# Patient Record
Sex: Female | Born: 1990 | Race: Black or African American | Hispanic: No | Marital: Single | Smoking: Former smoker
Health system: Southern US, Community
[De-identification: ages and names within clinical notes are randomized; demographics above are authoritative.]

## PROBLEM LIST (undated history)

## (undated) ENCOUNTER — Inpatient Hospital Stay (HOSPITAL_COMMUNITY): Payer: Self-pay

## (undated) DIAGNOSIS — A599 Trichomoniasis, unspecified: Secondary | ICD-10-CM

## (undated) DIAGNOSIS — A749 Chlamydial infection, unspecified: Secondary | ICD-10-CM

## (undated) HISTORY — PX: NO PAST SURGERIES: SHX2092

---
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
## 2008-06-09 ENCOUNTER — Inpatient Hospital Stay (HOSPITAL_COMMUNITY): Admission: AD | Admit: 2008-06-09 | Discharge: 2008-06-12 | Payer: Self-pay | Admitting: Obstetrics

## 2008-06-22 ENCOUNTER — Inpatient Hospital Stay (HOSPITAL_COMMUNITY): Admission: AD | Admit: 2008-06-22 | Discharge: 2008-06-22 | Payer: Self-pay | Admitting: Obstetrics

## 2008-07-14 ENCOUNTER — Inpatient Hospital Stay (HOSPITAL_COMMUNITY): Admission: AD | Admit: 2008-07-14 | Discharge: 2008-07-14 | Payer: Self-pay | Admitting: Obstetrics

## 2008-08-02 ENCOUNTER — Inpatient Hospital Stay (HOSPITAL_COMMUNITY): Admission: AD | Admit: 2008-08-02 | Discharge: 2008-08-05 | Payer: Self-pay | Admitting: Obstetrics

## 2008-08-03 ENCOUNTER — Encounter (INDEPENDENT_AMBULATORY_CARE_PROVIDER_SITE_OTHER): Payer: Self-pay | Admitting: Obstetrics

## 2009-04-26 ENCOUNTER — Ambulatory Visit: Payer: Self-pay | Admitting: Physician Assistant

## 2009-04-26 ENCOUNTER — Inpatient Hospital Stay (HOSPITAL_COMMUNITY): Admission: AD | Admit: 2009-04-26 | Discharge: 2009-04-26 | Payer: Self-pay | Admitting: Family Medicine

## 2009-09-28 ENCOUNTER — Ambulatory Visit (HOSPITAL_COMMUNITY): Admission: RE | Admit: 2009-09-28 | Discharge: 2009-09-28 | Payer: Self-pay | Admitting: Obstetrics

## 2009-10-03 ENCOUNTER — Inpatient Hospital Stay (HOSPITAL_COMMUNITY): Admission: AD | Admit: 2009-10-03 | Discharge: 2009-10-03 | Payer: Self-pay | Admitting: Obstetrics

## 2009-10-04 ENCOUNTER — Observation Stay (HOSPITAL_COMMUNITY): Admission: AD | Admit: 2009-10-04 | Discharge: 2009-10-04 | Payer: Self-pay | Admitting: Obstetrics

## 2009-10-05 ENCOUNTER — Inpatient Hospital Stay (HOSPITAL_COMMUNITY): Admission: AD | Admit: 2009-10-05 | Discharge: 2009-10-07 | Payer: Self-pay | Admitting: Obstetrics

## 2010-05-15 ENCOUNTER — Emergency Department (HOSPITAL_COMMUNITY): Admission: EM | Admit: 2010-05-15 | Discharge: 2010-05-16 | Payer: Self-pay | Admitting: Emergency Medicine

## 2010-06-10 ENCOUNTER — Emergency Department (HOSPITAL_COMMUNITY): Admission: EM | Admit: 2010-06-10 | Discharge: 2010-06-10 | Payer: Self-pay | Admitting: Emergency Medicine

## 2010-06-13 ENCOUNTER — Emergency Department (HOSPITAL_COMMUNITY): Admission: EM | Admit: 2010-06-13 | Discharge: 2010-06-14 | Payer: Self-pay | Admitting: Emergency Medicine

## 2010-06-14 ENCOUNTER — Emergency Department (HOSPITAL_COMMUNITY): Admission: EM | Admit: 2010-06-14 | Discharge: 2010-06-15 | Payer: Self-pay | Admitting: Emergency Medicine

## 2010-08-21 ENCOUNTER — Emergency Department (HOSPITAL_COMMUNITY)
Admission: EM | Admit: 2010-08-21 | Discharge: 2010-08-22 | Payer: Self-pay | Source: Home / Self Care | Admitting: Emergency Medicine

## 2010-08-21 LAB — COMPREHENSIVE METABOLIC PANEL
Albumin: 4.2 g/dL (ref 3.5–5.2)
Alkaline Phosphatase: 49 U/L (ref 39–117)
BUN: 9 mg/dL (ref 6–23)
CO2: 23 mEq/L (ref 19–32)
Chloride: 108 mEq/L (ref 96–112)
Glucose, Bld: 116 mg/dL — ABNORMAL HIGH (ref 70–99)
Potassium: 3.5 mEq/L (ref 3.5–5.1)
Total Bilirubin: 1.1 mg/dL (ref 0.3–1.2)

## 2010-08-21 LAB — DIFFERENTIAL
Lymphocytes Relative: 9 % — ABNORMAL LOW (ref 12–46)
Lymphs Abs: 1 10*3/uL (ref 0.7–4.0)
Neutro Abs: 9.2 10*3/uL — ABNORMAL HIGH (ref 1.7–7.7)
Neutrophils Relative %: 85 % — ABNORMAL HIGH (ref 43–77)

## 2010-08-21 LAB — URINALYSIS, ROUTINE W REFLEX MICROSCOPIC
Bilirubin Urine: NEGATIVE
Ketones, ur: 15 mg/dL — AB
Nitrite: NEGATIVE
Protein, ur: NEGATIVE mg/dL
Specific Gravity, Urine: 1.017 (ref 1.005–1.030)
Urine Glucose, Fasting: NEGATIVE mg/dL
Urobilinogen, UA: 1 mg/dL (ref 0.0–1.0)
pH: 8.5 — ABNORMAL HIGH (ref 5.0–8.0)

## 2010-08-21 LAB — CBC
HCT: 40.1 % (ref 36.0–46.0)
Hemoglobin: 13.2 g/dL (ref 12.0–15.0)
MCV: 87.4 fL (ref 78.0–100.0)
Platelets: 258 10*3/uL (ref 150–400)
RBC: 4.59 MIL/uL (ref 3.87–5.11)
WBC: 10.8 10*3/uL — ABNORMAL HIGH (ref 4.0–10.5)

## 2010-08-21 LAB — POCT PREGNANCY, URINE: Preg Test, Ur: NEGATIVE

## 2010-08-21 LAB — URINE MICROSCOPIC-ADD ON

## 2010-08-21 LAB — WET PREP, GENITAL: Yeast Wet Prep HPF POC: NONE SEEN

## 2010-08-23 LAB — GC/CHLAMYDIA PROBE AMP, GENITAL: GC Probe Amp, Genital: NEGATIVE

## 2010-10-16 LAB — CBC
Hemoglobin: 10.6 g/dL — ABNORMAL LOW (ref 12.0–15.0)
MCHC: 33.2 g/dL (ref 30.0–36.0)
RBC: 3.55 MIL/uL — ABNORMAL LOW (ref 3.87–5.11)

## 2010-10-27 LAB — GC/CHLAMYDIA PROBE AMP, GENITAL
Chlamydia, DNA Probe: NEGATIVE
GC Probe Amp, Genital: NEGATIVE

## 2010-10-27 LAB — WET PREP, GENITAL
Clue Cells Wet Prep HPF POC: NONE SEEN
Trich, Wet Prep: NONE SEEN

## 2010-11-07 LAB — CBC
MCHC: 33.8 g/dL (ref 30.0–36.0)
Platelets: 212 10*3/uL (ref 150–400)
Platelets: 244 10*3/uL (ref 150–400)
RBC: 3.7 MIL/uL — ABNORMAL LOW (ref 3.87–5.11)
RDW: 13.3 % (ref 11.5–15.5)
WBC: 11.6 10*3/uL — ABNORMAL HIGH (ref 4.0–10.5)

## 2010-11-07 LAB — RPR: RPR Ser Ql: NONREACTIVE

## 2010-11-14 ENCOUNTER — Emergency Department (HOSPITAL_COMMUNITY)
Admission: EM | Admit: 2010-11-14 | Discharge: 2010-11-15 | Disposition: A | Discharge status: Home / Self Care | Payer: Self-pay | Attending: Emergency Medicine | Admitting: Emergency Medicine

## 2010-11-14 DIAGNOSIS — F329 Major depressive disorder, single episode, unspecified: Secondary | ICD-10-CM | POA: Insufficient documentation

## 2010-11-14 DIAGNOSIS — W260XXA Contact with knife, initial encounter: Secondary | ICD-10-CM | POA: Insufficient documentation

## 2010-11-14 DIAGNOSIS — S51809A Unspecified open wound of unspecified forearm, initial encounter: Secondary | ICD-10-CM | POA: Insufficient documentation

## 2010-11-14 DIAGNOSIS — F3289 Other specified depressive episodes: Secondary | ICD-10-CM | POA: Insufficient documentation

## 2010-11-15 LAB — CBC
Hemoglobin: 13.4 g/dL (ref 12.0–15.0)
MCH: 28.9 pg (ref 26.0–34.0)
MCHC: 33 g/dL (ref 30.0–36.0)
Platelets: 275 10*3/uL (ref 150–400)

## 2010-11-15 LAB — BASIC METABOLIC PANEL
BUN: 5 mg/dL — ABNORMAL LOW (ref 6–23)
Chloride: 104 mEq/L (ref 96–112)
Creatinine, Ser: 0.69 mg/dL (ref 0.4–1.2)
Glucose, Bld: 101 mg/dL — ABNORMAL HIGH (ref 70–99)

## 2010-11-15 LAB — DIFFERENTIAL
Basophils Relative: 0 % (ref 0–1)
Eosinophils Absolute: 0.1 10*3/uL (ref 0.0–0.7)
Monocytes Absolute: 0.5 10*3/uL (ref 0.1–1.0)
Monocytes Relative: 7 % (ref 3–12)

## 2010-11-15 LAB — RAPID URINE DRUG SCREEN, HOSP PERFORMED
Barbiturates: NOT DETECTED
Benzodiazepines: NOT DETECTED
Cocaine: NOT DETECTED
Opiates: NOT DETECTED

## 2010-11-15 LAB — ETHANOL: Alcohol, Ethyl (B): <5 mg/dL (ref 0–10)

## 2011-04-25 DIAGNOSIS — A599 Trichomoniasis, unspecified: Secondary | ICD-10-CM

## 2011-04-25 HISTORY — DX: Trichomoniasis, unspecified: A59.9

## 2011-04-25 LAB — GC/CHLAMYDIA PROBE AMP, GENITAL: GC Probe Amp, Genital: NEGATIVE

## 2011-04-25 LAB — WET PREP, GENITAL: Yeast Wet Prep HPF POC: NONE SEEN

## 2011-05-02 LAB — CBC
HCT: 33.3 — ABNORMAL LOW
Hemoglobin: 11.5 — ABNORMAL LOW
Hemoglobin: 12.1
MCHC: 35.2
MCV: 89.1
Platelets: 262
RBC: 3.73 — ABNORMAL LOW
RBC: 3.88
WBC: 13.1 — ABNORMAL HIGH
WBC: 8.1

## 2012-01-09 ENCOUNTER — Inpatient Hospital Stay (HOSPITAL_COMMUNITY): Payer: Medicaid Other

## 2012-01-09 ENCOUNTER — Inpatient Hospital Stay (HOSPITAL_COMMUNITY)
Admission: AD | Admit: 2012-01-09 | Discharge: 2012-01-09 | Disposition: A | Discharge status: Home / Self Care | Payer: Medicaid Other | Source: Ambulatory Visit | Attending: Family Medicine | Admitting: Family Medicine

## 2012-01-09 ENCOUNTER — Encounter (HOSPITAL_COMMUNITY): Payer: Self-pay | Admitting: *Deleted

## 2012-01-09 DIAGNOSIS — Z331 Pregnant state, incidental: Secondary | ICD-10-CM

## 2012-01-09 DIAGNOSIS — R109 Unspecified abdominal pain: Secondary | ICD-10-CM | POA: Insufficient documentation

## 2012-01-09 DIAGNOSIS — Z3490 Encounter for supervision of normal pregnancy, unspecified, unspecified trimester: Secondary | ICD-10-CM

## 2012-01-09 DIAGNOSIS — O99891 Other specified diseases and conditions complicating pregnancy: Secondary | ICD-10-CM | POA: Insufficient documentation

## 2012-01-09 DIAGNOSIS — O210 Mild hyperemesis gravidarum: Secondary | ICD-10-CM | POA: Insufficient documentation

## 2012-01-09 HISTORY — DX: Chlamydial infection, unspecified: A74.9

## 2012-01-09 HISTORY — DX: Trichomoniasis, unspecified: A59.9

## 2012-01-09 LAB — URINALYSIS, ROUTINE W REFLEX MICROSCOPIC
Glucose, UA: NEGATIVE mg/dL
Leukocytes, UA: NEGATIVE
Protein, ur: NEGATIVE mg/dL
Specific Gravity, Urine: 1.015 (ref 1.005–1.030)
pH: 7 (ref 5.0–8.0)

## 2012-01-09 LAB — WET PREP, GENITAL: Yeast Wet Prep HPF POC: NONE SEEN

## 2012-01-09 LAB — POCT PREGNANCY, URINE: Preg Test, Ur: POSITIVE — AB

## 2012-01-09 MED ORDER — PRENATAL VITAMINS (DIS) PO TABS: 1.0000 | ORAL_TABLET | Freq: Every day | ORAL | Status: DC

## 2012-01-09 NOTE — MAU Provider Note (Signed)
  History     CSN: [id number]  Arrival date and time: 01/09/12 1318   First Provider Initiated Contact with Patient 01/09/12 1423      Chief Complaint  Patient presents with  . Abdominal Pain  . Possible Pregnancy  . Emesis   HPI  This is a 21 year old G5P4 with a new diagnosis of pregnancy on this visit.  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  Her pain is left central, mild.    She is concerned about her STD risk.  She is unsure about her LMP  OB History    Grav Para Term Preterm Abortions TAB SAB Ect Mult Living   5 4 4  0 0 0 0 0 0 4      Past Medical History  Diagnosis Date  . Trichomonas   . Chlamydia     Past Surgical History  Procedure Date  . Vaginal delivery     History reviewed. No pertinent family history.  History  Substance Use Topics  . Smoking status: Current Everyday Smoker -- 0.1 packs/day for 3 years  . Smokeless tobacco: Never Used  . Alcohol Use: Yes     social    Allergies: No Known Allergies  No prescriptions prior to admission    ROS See HPI Physical Exam   Blood pressure 111/71, pulse 73, temperature 98.2 F (36.8 C), temperature source Oral, resp. rate 16, height 5\' 1"  (1.549 m), weight 64.864 kg (143 lb), last menstrual period 10/09/2011, SpO2 100.00%.  Physical Exam  Constitutional: She is oriented to person, place, and time. She appears well-developed and well-nourished.  HENT:  Head: Normocephalic and atraumatic.  Eyes: Conjunctivae and EOM are normal. Right eye exhibits no discharge. Left eye exhibits no discharge. No scleral icterus.  Neck: No tracheal deviation present.  Respiratory: Effort normal. No stridor. No respiratory distress.  GI: Soft. She exhibits no distension. There is no tenderness. There is no rebound and no guarding.  Neurological: She is alert and oriented to person, place, and time.  Skin: Skin is warm and dry.  Psychiatric: She has a normal mood and  affect. Her behavior is normal. Judgment and thought content normal.   SE: Vagina and labia normal.  Cervix normal.   Bimanual 8-10 w size, normal adnexa, no masses, nontender.  MAU Course  Procedures  Fetal heart tones could not be obtained by doppler  Assessment and Plan  Ultrasound demonstrating [redacted]w[redacted]d pregnancy, dates adjusted.  IUP.  Cardiac activity. GC/chlam pending at time of discharge Wet prep with few clue cells, asymptomatic, would not treat.  Instructed to f/u with chosen prenatal provider, Dr. Gaynell Face.  Clancy Gourd 01/09/2012, 2:37 PM

## 2012-01-09 NOTE — Discharge Instructions (Signed)
ABCs of Pregnancy A Antepartum care is very important. Be sure you see your doctor and get prenatal care as soon as you think you are pregnant. At this time, you will be tested for infection, genetic abnormalities and potential problems with you and the pregnancy. This is the time to discuss diet, exercise, work, medications, labor, pain medication during labor and the possibility of a cesarean delivery. Ask any questions that may concern you. It is important to see your doctor regularly throughout your pregnancy. Avoid exposure to toxic substances and chemicals - such as cleaning solvents, lead and mercury, some insecticides, and paint. Pregnant women should avoid exposure to paint fumes, and fumes that cause you to feel ill, dizzy or faint. When possible, it is a good idea to have a pre-pregnancy consultation with your caregiver to begin some important recommendations your caregiver suggests such as, taking folic acid, exercising, quitting smoking, avoiding alcoholic beverages, etc. B Breastfeeding is the healthiest choice for both you and your baby. It has many nutritional benefits for the baby and health benefits for the mother. It also creates a very tight and loving bond between the baby and mother. Talk to your doctor, your family and friends, and your employer about how you choose to feed your baby and how they can support you in your decision. Not all birth defects can be prevented, but a woman can take actions that may increase her chance of having a healthy baby. Many birth defects happen very early in pregnancy, sometimes before a woman even knows she is pregnant. Birth defects or abnormalities of any child in your or the father's family should be discussed with your caregiver. Get a good support bra as your breast size changes. Wear it especially when you exercise and when nursing.  C Celebrate the news of your pregnancy with the your spouse/father and family. Childbirth classes are helpful to  take for you and the spouse/father because it helps to understand what happens during the pregnancy, labor and delivery. Cesarean delivery should be discussed with your doctor so you are prepared for that possibility. The pros and cons of circumcision if it is a boy, should be discussed with your pediatrician. Cigarette smoking during pregnancy can result in low birth weight babies. It has been associated with infertility, miscarriages, tubal pregnancies, infant death (mortality) and poor health (morbidity) in childhood. Additionally, cigarette smoking may cause long-term learning disabilities. If you smoke, you should try to quit before getting pregnant and not smoke during the pregnancy. Secondary smoke may also harm a mother and her developing baby. It is a good idea to ask people to stop smoking around you during your pregnancy and after the baby is born. Extra calcium is necessary when you are pregnant and is found in your prenatal vitamin, in dairy products, green leafy vegetables and in calcium supplements. D A healthy diet according to your current weight and height, along with vitamins and mineral supplements should be discussed with your caregiver. Domestic abuse or violence should be made known to your doctor right away to get the situation corrected. Drink more water when you exercise to keep hydrated. Discomfort of your back and legs usually develops and progresses from the middle of the second trimester through to delivery of the baby. This is because of the enlarging baby and uterus, which may also affect your balance. Do not take illegal drugs. Illegal drugs can seriously harm the baby and you. Drink extra fluids (water is best) throughout pregnancy to help   your body keep up with the increases in your blood volume. Drink at least 6 to 8 glasses of water, fruit juice, or milk each day. A good way to know you are drinking enough fluid is when your urine looks almost like clear water or is very light  yellow.  E Eat healthy to get the nutrients you and your unborn baby need. Your meals should include the five basic food groups. Exercise (30 minutes of light to moderate exercise a day) is important and encouraged during pregnancy, if there are no medical problems or problems with the pregnancy. Exercise that causes discomfort or dizziness should be stopped and reported to your caregiver. Emotions during pregnancy can change from being ecstatic to depression and should be understood by you, your partner and your family. F Fetal screening with ultrasound, amniocentesis and monitoring during pregnancy and labor is common and sometimes necessary. Take 400 micrograms of folic acid daily both before, when possible, and during the first few months of pregnancy to reduce the risk of birth defects of the brain and spine. All women who could possibly become pregnant should take a vitamin with folic acid, every day. It is also important to eat a healthy diet with fortified foods (enriched grain products, including cereals, rice, breads, and pastas) and foods with natural sources of folate (orange juice, green leafy vegetables, beans, peanuts, broccoli, asparagus, peas, and lentils). The father should be involved with all aspects of the pregnancy including, the prenatal care, childbirth classes, labor, delivery, and postpartum time. Fathers may also have emotional concerns about being a father, financial needs, and raising a family. G Genetic testing should be done appropriately. It is important to know your family and the father's history. If there have been problems with pregnancies or birth defects in your family, report these to your doctor. Also, genetic counselors can talk with you about the information you might need in making decisions about having a family. You can call a major medical center in your area for help in finding a board-certified genetic counselor. Genetic testing and counseling should be done  before pregnancy when possible, especially if there is a history of problems in the mother's or father's family. Certain ethnic backgrounds are more at risk for genetic defects. H Get familiar with the hospital where you will be having your baby. Get to know how long it takes to get there, the labor and delivery area, and the hospital procedures. Be sure your medical insurance is accepted there. Get your home ready for the baby including, clothes, the baby's room (when possible), furniture and car seat. Hand washing is important throughout the day, especially after handling raw meat and poultry, changing the baby's diaper or using the bathroom. This can help prevent the spread of many bacteria and viruses that cause infection. Your hair may become dry and thinner, but will return to normal a few weeks after the baby is born. Heartburn is a common problem that can be treated by taking antacids recommended by your caregiver, eating smaller meals 5 or 6 times a day, not drinking liquids when eating, drinking between meals and raising the head of your bed 2 to 3 inches. I Insurance to cover you, the baby, doctor and hospital should be reviewed so that you will be prepared to pay any costs not covered by your insurance plan. If you do not have medical insurance, there are usually clinics and services available for you in your community. Take 30 milligrams of iron during   your pregnancy as prescribed by your doctor to reduce the risk of low red blood cells (anemia) later in pregnancy. All women of childbearing age should eat a diet rich in iron. J There should be a joint effort for the mother, father and any other children to adapt to the pregnancy financially, emotionally, and psychologically during the pregnancy. Join a support group for moms-to-be. Or, join a class on parenting or childbirth. Have the family participate when possible. K Know your limits. Let your caregiver know if you experience any of the  following:   Pain of any kind.   Strong cramps.   You develop a lot of weight in a short period of time (5 pounds in 3 to 5 days).   Vaginal bleeding, leaking of amniotic fluid.   Headache, vision problems.   Dizziness, fainting, shortness of breath.   Chest pain.   Fever of 102 F (38.9 C) or higher.   Gush of clear fluid from your vagina.   Painful urination.   Domestic violence.   Irregular heartbeat (palpitations).   Rapid beating of the heart (tachycardia).   Constant feeling sick to your stomach (nauseous) and vomiting.   Trouble walking, fluid retention (edema).   Muscle weakness.   If your baby has decreased activity.   Persistent diarrhea.   Abnormal vaginal discharge.   Uterine contractions at 20-minute intervals.   Back pain that travels down your leg.  L Learn and practice that what you eat and drink should be in moderation and healthy for you and your baby. Legal drugs such as alcohol and caffeine are important issues for pregnant women. There is no safe amount of alcohol a woman can drink while pregnant. Fetal alcohol syndrome, a disorder characterized by growth retardation, facial abnormalities, and central nervous system dysfunction, is caused by a woman's use of alcohol during pregnancy. Caffeine, found in tea, coffee, soft drinks and chocolate, should also be limited. Be sure to read labels when trying to cut down on caffeine during pregnancy. More than 200 foods, beverages, and over-the-counter medications contain caffeine and have a high salt content! There are coffees and teas that do not contain caffeine. M Medical conditions such as diabetes, epilepsy, and high blood pressure should be treated and kept under control before pregnancy when possible, but especially during pregnancy. Ask your caregiver about any medications that may need to be changed or adjusted during pregnancy. If you are currently taking any medications, ask your caregiver if it  is safe to take them while you are pregnant or before getting pregnant when possible. Also, be sure to discuss any herbs or vitamins you are taking. They are medicines, too! Discuss with your doctor all medications, prescribed and over-the-counter, that you are taking. During your prenatal visit, discuss the medications your doctor may give you during labor and delivery. N Never be afraid to ask your doctor or caregiver questions about your health, the progress of the pregnancy, family problems, stressful situations, and recommendation for a pediatrician, if you do not have one. It is better to take all precautions and discuss any questions or concerns you may have during your office visits. It is a good idea to write down your questions before you visit the doctor. O Over-the-counter cough and cold remedies may contain alcohol or other ingredients that should be avoided during pregnancy. Ask your caregiver about prescription, herbs or over-the-counter medications that you are taking or may consider taking while pregnant.  P Physical activity during pregnancy can   benefit both you and your baby by lessening discomfort and fatigue, providing a sense of well-being, and increasing the likelihood of early recovery after delivery. Light to moderate exercise during pregnancy strengthens the belly (abdominal) and back muscles. This helps improve posture. Practicing yoga, walking, swimming, and cycling on a stationary bicycle are usually safe exercises for pregnant women. Avoid scuba diving, exercise at high altitudes (over 3000 feet), skiing, horseback riding, contact sports, etc. Always check with your doctor before beginning any kind of exercise, especially during pregnancy and especially if you did not exercise before getting pregnant. Q Queasiness, stomach upset and morning sickness are common during pregnancy. Eating a couple of crackers or dry toast before getting out of bed. Foods that you normally love may  make you feel sick to your stomach. You may need to substitute other nutritious foods. Eating 5 or 6 small meals a day instead of 3 large ones may make you feel better. Do not drink with your meals, drink between meals. Questions that you have should be written down and asked during your prenatal visits. R Read about and make plans to baby-proof your home. There are important tips for making your home a safer environment for your baby. Review the tips and make your home safer for you and your baby. Read food labels regarding calories, salt and fat content in the food. S Saunas, hot tubs, and steam rooms should be avoided while you are pregnant. Excessive high heat may be harmful during your pregnancy. Your caregiver will screen and examine you for sexually transmitted diseases and genetic disorders during your prenatal visits. Learn the signs of labor. Sexual relations while pregnant is safe unless there is a medical or pregnancy problem and your caregiver advises against it. T Traveling long distances should be avoided especially in the third trimester of your pregnancy. If you do have to travel out of state, be sure to take a copy of your medical records and medical insurance plan with you. You should not travel long distances without seeing your doctor first. Most airlines will not allow you to travel after 36 weeks of pregnancy. Toxoplasmosis is an infection caused by a parasite that can seriously harm an unborn baby. Avoid eating undercooked meat and handling cat litter. Be sure to wear gloves when gardening. Tingling of the hands and fingers is not unusual and is due to fluid retention. This will go away after the baby is born. U Womb (uterus) size increases during the first trimester. Your kidneys will begin to function more efficiently. This may cause you to feel the need to urinate more often. You may also leak urine when sneezing, coughing or laughing. This is due to the growing uterus pressing  against your bladder, which lies directly in front of and slightly under the uterus during the first few months of pregnancy. If you experience burning along with frequency of urination or bloody urine, be sure to tell your doctor. The size of your uterus in the third trimester may cause a problem with your balance. It is advisable to maintain good posture and avoid wearing high heels during this time. An ultrasound of your baby may be necessary during your pregnancy and is safe for you and your baby. V Vaccinations are an important concern for pregnant women. Get needed vaccines before pregnancy. Center for Disease Control (www.cdc.gov) has clear guidelines for the use of vaccines during pregnancy. Review the list, be sure to discuss it with your doctor. Prenatal vitamins are helpful   and healthy for you and the baby. Do not take extra vitamins except what is recommended. Taking too much of certain vitamins can cause overdose problems. Continuous vomiting should be reported to your caregiver. Varicose veins may appear especially if there is a family history of varicose veins. They should subside after the delivery of the baby. Support hose helps if there is leg discomfort. W Being overweight or underweight during pregnancy may cause problems. Try to get within 15 pounds of your ideal weight before pregnancy. Remember, pregnancy is not a time to be dieting! Do not stop eating or start skipping meals as your weight increases. Both you and your baby need the calories and nutrition you receive from a healthy diet. Be sure to consult with your doctor about your diet. There is a formula and diet plan available depending on whether you are overweight or underweight. Your caregiver or nutritionist can help and advise you if necessary. X Avoid X-rays. If you must have dental work or diagnostic tests, tell your dentist or physician that you are pregnant so that extra care can be taken. X-rays should only be taken when  the risks of not taking them outweigh the risk of taking them. If needed, only the minimum amount of radiation should be used. When X-rays are necessary, protective lead shields should be used to cover areas of the body that are not being X-rayed. Y Your baby loves you. Breastfeeding your baby creates a loving and very close bond between the two of you. Give your baby a healthy environment to live in while you are pregnant. Infants and children require constant care and guidance. Their health and safety should be carefully watched at all times. After the baby is born, rest or take a nap when the baby is sleeping. Z Get your ZZZs. Be sure to get plenty of rest. Resting on your side as often as possible, especially on your left side is advised. It provides the best circulation to your baby and helps reduce swelling. Try taking a nap for 30 to 45 minutes in the afternoon when possible. After the baby is born rest or take a nap when the baby is sleeping. Try elevating your feet for that amount of time when possible. It helps the circulation in your legs and helps reduce swelling.  Most information courtesy of the CDC. Document Released: 07/10/2005 Document Revised: 06/29/2011 Document Reviewed: 03/24/2009 [employer] Patient Information 2012 [employer]. 

## 2012-01-09 NOTE — MAU Note (Signed)
Patient state she is unsure of her last period. States she has had nausea and vomiting every day, everything she eats for at least 3 months. Has been having abdominal pain on the left side of the abdomen at the umbilical level for about 2 weeks. No bleeding. [place] vaginal discharge with an odor for a couple of weeks.

## 2012-01-09 NOTE — MAU Provider Note (Signed)
Seen and examined by me.   Agree with note

## 2012-03-14 LAB — OB RESULTS CONSOLE HEPATITIS B SURFACE ANTIGEN: Hepatitis B Surface Ag: NEGATIVE

## 2012-03-14 LAB — OB RESULTS CONSOLE RPR: RPR: NONREACTIVE

## 2012-03-14 LAB — OB RESULTS CONSOLE HIV ANTIBODY (ROUTINE TESTING): HIV: NONREACTIVE

## 2012-03-14 LAB — OB RESULTS CONSOLE ABO/RH: RH Type: POSITIVE

## 2012-05-05 ENCOUNTER — Encounter (HOSPITAL_COMMUNITY): Payer: Self-pay | Admitting: *Deleted

## 2012-05-05 ENCOUNTER — Inpatient Hospital Stay (HOSPITAL_COMMUNITY)
Admission: AD | Admit: 2012-05-05 | Discharge: 2012-05-05 | Disposition: A | Discharge status: Hospice | Payer: Medicaid Other | Source: Ambulatory Visit | Attending: Obstetrics | Admitting: Obstetrics

## 2012-05-05 DIAGNOSIS — O4700 False labor before 37 completed weeks of gestation, unspecified trimester: Secondary | ICD-10-CM | POA: Insufficient documentation

## 2012-05-05 DIAGNOSIS — R109 Unspecified abdominal pain: Secondary | ICD-10-CM | POA: Insufficient documentation

## 2012-05-05 DIAGNOSIS — M62838 Other muscle spasm: Secondary | ICD-10-CM

## 2012-05-05 DIAGNOSIS — O368190 Decreased fetal movements, unspecified trimester, not applicable or unspecified: Secondary | ICD-10-CM | POA: Insufficient documentation

## 2012-05-05 DIAGNOSIS — O479 False labor, unspecified: Secondary | ICD-10-CM

## 2012-05-05 LAB — URINALYSIS, ROUTINE W REFLEX MICROSCOPIC
Glucose, UA: NEGATIVE mg/dL
Ketones, ur: NEGATIVE mg/dL
Leukocytes, UA: NEGATIVE
pH: 7.5 (ref 5.0–8.0)

## 2012-05-05 LAB — WET PREP, GENITAL: Clue Cells Wet Prep HPF POC: NONE SEEN

## 2012-05-05 MED ORDER — TERBUTALINE SULFATE 1 MG/ML IJ SOLN
INTRAMUSCULAR | Status: AC
  Administered 2012-05-05: 0.25 mg
  Filled 2012-05-05: qty 1

## 2012-05-05 MED ORDER — CYCLOBENZAPRINE HCL 10 MG PO TABS
10.0000 mg | ORAL_TABLET | Freq: Once | ORAL | Status: AC
  Administered 2012-05-05: 10 mg via ORAL
  Filled 2012-05-05: qty 1

## 2012-05-05 MED ORDER — OXYCODONE-ACETAMINOPHEN 5-325 MG PO TABS
1.0000 | ORAL_TABLET | Freq: Once | ORAL | Status: AC
  Administered 2012-05-05: 1 via ORAL
  Filled 2012-05-05: qty 1

## 2012-05-05 MED ORDER — TERBUTALINE SULFATE 1 MG/ML IJ SOLN: 0.2500 mg | Freq: Once | INTRAMUSCULAR | Status: DC

## 2012-05-05 MED ORDER — CYCLOBENZAPRINE HCL 10 MG PO TABS: 10.0000 mg | ORAL_TABLET | Freq: Three times a day (TID) | ORAL | Status: DC | PRN

## 2012-05-05 NOTE — MAU Provider Note (Signed)
History     CSN: [id number]  Arrival date and time: 05/05/12 1556   None     Chief Complaint  Patient presents with  . Abdominal Pain  . Decreased Fetal Movement   HPI 21 y.o. G5P4004 at [redacted]w[redacted]d with midback and upper abd pain, mostly back, starting after lifting son today. Constant, hurts more with movement. No bleeding or LOF. Pt denies contractions or tightening in abdomen.    Past Medical History  Diagnosis Date  . Trichomonas 04/25/2011  . Chlamydia     Past Surgical History  Procedure Date  . Vaginal delivery     History reviewed. No pertinent family history.  History  Substance Use Topics  . Smoking status: Current Every Day Smoker -- 0.1 packs/day for 3 years  . Smokeless tobacco: Never Used  . Alcohol Use: Yes     social, not while pregnant    Allergies: No Known Allergies  No prescriptions prior to admission    Review of Systems  Constitutional: Negative.   Respiratory: Negative.   Cardiovascular: Negative.   Gastrointestinal: Positive for abdominal pain. Negative for nausea, vomiting, diarrhea and constipation.  Genitourinary: Negative for dysuria, urgency, frequency, hematuria and flank pain.       Negative for vaginal bleeding, cramping/contractions  Musculoskeletal: Positive for back pain.  Neurological: Negative.   Psychiatric/Behavioral: Negative.    Physical Exam   Blood pressure 101/57, pulse 77, temperature 98.2 F (36.8 C), temperature source Oral, resp. rate 16, height 5\' 2"  (1.575 m), weight 164 lb (74.39 kg), last menstrual period 10/09/2011, SpO2 99.00%.  Physical Exam  Nursing note and vitals reviewed. Constitutional: She is oriented to person, place, and time. She appears well-developed and well-nourished.  Cardiovascular: Normal rate.   Respiratory: Effort normal.  GI: Soft. She exhibits no mass. There is tenderness (diffuse). There is no rebound and no guarding.  Genitourinary: Cervix exhibits no friability. No bleeding  around the vagina. Vaginal discharge (yellow, moderate) found.       SVE: ext os 1/int os closed/thick/high  Musculoskeletal: Normal range of motion.  Neurological: She is alert and oriented to person, place, and time.  Skin: Skin is warm and dry.  Psychiatric: She has a normal mood and affect.   EFM: 150s, mod variability TOCO: UC q2-7 minutes  MAU Course  Procedures  Results for orders placed during the hospital encounter of 05/05/12 (from the past 24 hour(s))  WET PREP, GENITAL     Status: Abnormal   Collection Time   05/05/12  6:18 PM      Component Value Range   Yeast Wet Prep HPF POC FEW (*) NONE SEEN   Trich, Wet Prep NONE SEEN  NONE SEEN   Clue Cells Wet Prep HPF POC NONE SEEN  NONE SEEN   WBC, Wet Prep HPF POC MANY (*) NONE SEEN  FETAL FIBRONECTIN     Status: Normal   Collection Time   05/05/12  6:18 PM      Component Value Range   Fetal Fibronectin NEGATIVE  NEGATIVE  GC/CHLAMYDIA PROBE AMP, GENITAL     Status: Normal   Collection Time   05/05/12  6:18 PM      Component Value Range   GC Probe Amp, Genital NEGATIVE  NEGATIVE   Chlamydia, DNA Probe NEGATIVE  NEGATIVE     Assessment and Plan   1. Muscle spasm   2. Preterm contractions       Medication List     As of 05/06/2012  4:30 PM    START taking these medications         cyclobenzaprine 10 MG tablet   Commonly known as: FLEXERIL   Take 1 tablet (10 mg total) by mouth 3 (three) times daily as needed for muscle spasms.      CONTINUE taking these medications         Prenatal Vitamins (DIS) Tabs   Take 1 tablet by mouth daily.          Where to get your medications    These are the prescriptions that you need to pick up. We sent them to a specific pharmacy, so you will need to go there to get them.   [facility] [address] [place], [place] - [address]    [address] [place] [address]    Phone: [phone]        cyclobenzaprine 10 MG tablet             Follow-up Information    Follow up with MARSHALL,BERNARD A, MD. (as scheduled)    Contact information:   [address] [place] [address] [phone]            FRAZIER,NATALIE 05/06/2012, 4:27 PM

## 2012-05-05 NOTE — MAU Note (Signed)
Patient states she has been having pain since 1400, upper abdominal that radiates to the back. Reports fetal movement, not as much as usual. Denies bleeding or leaking.

## 2012-05-05 NOTE — MAU Note (Signed)
Pt picked up her two year old and right after that she says her low back started to hurt.  States it feels like, " the back is spasming and it is going up my back".  No bleeding or urinary problems.

## 2012-05-06 LAB — GC/CHLAMYDIA PROBE AMP, GENITAL: Chlamydia, DNA Probe: NEGATIVE

## 2012-05-07 ENCOUNTER — Inpatient Hospital Stay (HOSPITAL_COMMUNITY)
Admission: AD | Admit: 2012-05-07 | Discharge: 2012-05-07 | Disposition: A | Discharge status: Home / Self Care | Payer: Medicaid Other | Source: Ambulatory Visit | Attending: Obstetrics | Admitting: Obstetrics

## 2012-05-07 ENCOUNTER — Encounter (HOSPITAL_COMMUNITY): Payer: Self-pay

## 2012-05-07 ENCOUNTER — Inpatient Hospital Stay (HOSPITAL_COMMUNITY): Payer: Medicaid Other

## 2012-05-07 DIAGNOSIS — R1011 Right upper quadrant pain: Secondary | ICD-10-CM | POA: Insufficient documentation

## 2012-05-07 DIAGNOSIS — K8020 Calculus of gallbladder without cholecystitis without obstruction: Secondary | ICD-10-CM | POA: Insufficient documentation

## 2012-05-07 DIAGNOSIS — R10811 Right upper quadrant abdominal tenderness: Secondary | ICD-10-CM

## 2012-05-07 DIAGNOSIS — O479 False labor, unspecified: Secondary | ICD-10-CM

## 2012-05-07 DIAGNOSIS — O4700 False labor before 37 completed weeks of gestation, unspecified trimester: Secondary | ICD-10-CM | POA: Insufficient documentation

## 2012-05-07 DIAGNOSIS — O9989 Other specified diseases and conditions complicating pregnancy, childbirth and the puerperium: Secondary | ICD-10-CM | POA: Insufficient documentation

## 2012-05-07 LAB — COMPREHENSIVE METABOLIC PANEL WITH GFR
ALT: 69 U/L — ABNORMAL HIGH (ref 0–35)
AST: 83 U/L — ABNORMAL HIGH (ref 0–37)
Albumin: 3 g/dL — ABNORMAL LOW (ref 3.5–5.2)
Alkaline Phosphatase: 97 U/L (ref 39–117)
BUN: 7 mg/dL (ref 6–23)
CO2: 25 meq/L (ref 19–32)
Calcium: 8.6 mg/dL (ref 8.4–10.5)
Chloride: 101 meq/L (ref 96–112)
Creatinine, Ser: 0.48 mg/dL — ABNORMAL LOW (ref 0.50–1.10)
GFR calc Af Amer: >90 mL/min
GFR calc non Af Amer: >90 mL/min
Glucose, Bld: 76 mg/dL (ref 70–99)
Potassium: 3.8 meq/L (ref 3.5–5.1)
Sodium: 136 meq/L (ref 135–145)
Total Bilirubin: 0.7 mg/dL (ref 0.3–1.2)
Total Protein: 6.2 g/dL (ref 6.0–8.3)

## 2012-05-07 LAB — URINALYSIS, ROUTINE W REFLEX MICROSCOPIC
Glucose, UA: NEGATIVE mg/dL
Leukocytes, UA: NEGATIVE
pH: 8.5 — ABNORMAL HIGH (ref 5.0–8.0)

## 2012-05-07 LAB — LIPASE, BLOOD: Lipase: 33 U/L (ref 11–59)

## 2012-05-07 LAB — CBC
HCT: 35.6 % — ABNORMAL LOW (ref 36.0–46.0)
MCHC: 34 g/dL (ref 30.0–36.0)
MCV: 90.8 fL (ref 78.0–100.0)
RDW: 12.1 % (ref 11.5–15.5)

## 2012-05-07 LAB — AMYLASE: Amylase: 101 U/L (ref 0–105)

## 2012-05-07 MED ORDER — LACTATED RINGERS IV SOLN
INTRAVENOUS | Status: DC
  Administered 2012-05-07: 18:00:00 via INTRAVENOUS

## 2012-05-07 MED ORDER — TERBUTALINE SULFATE 1 MG/ML IJ SOLN
INTRAMUSCULAR | Status: AC
  Filled 2012-05-07: qty 1

## 2012-05-07 MED ORDER — PROMETHAZINE HCL 25 MG/ML IJ SOLN
25.0000 mg | Freq: Once | INTRAVENOUS | Status: DC
  Filled 2012-05-07: qty 1

## 2012-05-07 MED ORDER — PROMETHAZINE HCL 25 MG/ML IJ SOLN: 25.0000 mg | Freq: Four times a day (QID) | INTRAMUSCULAR | Status: DC | PRN

## 2012-05-07 MED ORDER — HYDROMORPHONE HCL PF 1 MG/ML IJ SOLN
1.0000 mg | Freq: Once | INTRAMUSCULAR | Status: AC
  Administered 2012-05-07: 1 mg via INTRAVENOUS
  Filled 2012-05-07: qty 1

## 2012-05-07 MED ORDER — OXYCODONE-ACETAMINOPHEN 5-500 MG PO CAPS: 1.0000 | ORAL_CAPSULE | Freq: Four times a day (QID) | ORAL | Status: DC | PRN

## 2012-05-07 MED ORDER — TERBUTALINE SULFATE 1 MG/ML IJ SOLN
0.2500 mg | Freq: Once | INTRAMUSCULAR | Status: AC
  Administered 2012-05-07: 0.25 mg via SUBCUTANEOUS

## 2012-05-07 MED ORDER — PROMETHAZINE HCL 25 MG/ML IJ SOLN
25.0000 mg | Freq: Once | INTRAVENOUS | Status: AC
  Administered 2012-05-07: 25 mg via INTRAVENOUS
  Filled 2012-05-07: qty 1

## 2012-05-07 NOTE — MAU Note (Signed)
Patient is brought in by ems with c/o ongoing back pain that she was seen 2 days for. She states that she was not given rx for flexeil or pain medication. She states that the back pain is constant. Denies vaginal bleeding or lof. Reports good fetal movement

## 2012-05-07 NOTE — MAU Provider Note (Signed)
Chief Complaint:  Back Pain   First Provider Initiated Contact with Patient 05/07/12 1714      HPI: Shelby Nichols is a 21 y.o. G5P4004 at 79w5dwho presents to maternity admissions reporting brought in by EMS with right flank pain and RUQ pain 2 days for. Much worse today, constant. New onset N/V. Pain relieved 05/05/12 w/ percocet and flexeril. Did not get pick up rx. Denies fever, dysuria, hematuria, frequency, urgency, vaginal bleeding or lof. fFN neg 05/05/12. Reports good fetal movement. Last meal bacon and eggs.   Past Medical History: Past Medical History  Diagnosis Date  . Trichomonas 04/25/2011  . Chlamydia     Past obstetric history: OB History    Grav Para Term Preterm Abortions TAB SAB Ect Mult Living   5 4 4  0 0 0 0 0 0 4     # Outc Date GA Lbr Len/2nd Wgt Sex Del Anes PTL Lv   1 TRM            2 TRM            3 TRM            4 TRM            5 CUR               Past Surgical History: Past Surgical History  Procedure Date  . Vaginal delivery     Family History: History reviewed. No pertinent family history.  Social History: History  Substance Use Topics  . Smoking status: Current Every Day Smoker -- 0.1 packs/day for 3 years  . Smokeless tobacco: Never Used  . Alcohol Use: Yes     social, not while pregnant    Allergies: No Known Allergies  Meds:  No prescriptions prior to admission    ROS: Pertinent findings in history of present illness.  Physical Exam  Blood pressure 105/60, pulse 91, temperature 98.8 F (37.1 C), temperature source Oral, resp. rate 16, last menstrual period 10/09/2011. GENERAL: Well-developed, well-nourished female in moderate distress.  HEENT: normocephalic HEART: normal rate RESP: normal effort ABDOMEN: Soft, RUQ moderately tender, gravid appropriate for gestational age. ?mild right CVAT.  EXTREMITIES: Nontender, no edema NEURO: alert and oriented SPECULUM EXAM: deferred due to recent exam Cervical Position:  closed, Posterior. Exam limited by maternal mvmt and position due to pain. Will repeat after pain meds.  Exam by:: Dorathy Kinsman cnm  2053 Dilation: Fingertip Effacement (%):  (long) Cervical Position: Posterior Station: -3 Exam by:: Tewana Bohlen cnm   FHT:  Baseline 150 , moderate variability, no accelerations, no decelerations Contractions: ?UI initially, w/ poor tracing due to maternal mvmt. After pain meds, Q 2-3 min   Labs: Results for orders placed during the hospital encounter of 05/07/12 (from the past 24 hour(s))  CBC     Status: Abnormal   Collection Time   05/07/12  5:30 PM      Component Value Range   WBC 8.3  4.0 - 10.5 K/uL   RBC 3.92  3.87 - 5.11 MIL/uL   Hemoglobin 12.1  12.0 - 15.0 g/dL   HCT 45.4 (*) 09.8 - 11.9 %   MCV 90.8  78.0 - 100.0 fL   MCH 30.9  26.0 - 34.0 pg   MCHC 34.0  30.0 - 36.0 g/dL   RDW 14.7  82.9 - 56.2 %   Platelets 229  150 - 400 K/uL  COMPREHENSIVE METABOLIC PANEL     Status: Abnormal  Collection Time   05/07/12  5:30 PM      Component Value Range   Sodium 136  135 - 145 mEq/L   Potassium 3.8  3.5 - 5.1 mEq/L   Chloride 101  96 - 112 mEq/L   CO2 25  19 - 32 mEq/L   Glucose, Bld 76  70 - 99 mg/dL   BUN 7  6 - 23 mg/dL   Creatinine, Ser 4.09 (*) 0.50 - 1.10 mg/dL   Calcium 8.6  8.4 - 81.1 mg/dL   Total Protein 6.2  6.0 - 8.3 g/dL   Albumin 3.0 (*) 3.5 - 5.2 g/dL   AST 83 (*) 0 - 37 U/L   ALT 69 (*) 0 - 35 U/L   Alkaline Phosphatase 97  39 - 117 U/L   Total Bilirubin 0.7  0.3 - 1.2 mg/dL   GFR calc non Af Amer >90  >90 mL/min   GFR calc Af Amer >90  >90 mL/min  AMYLASE     Status: Normal   Collection Time   05/07/12  5:30 PM      Component Value Range   Amylase 101  0 - 105 U/L  LIPASE, BLOOD     Status: Normal   Collection Time   05/07/12  5:30 PM      Component Value Range   Lipase 33  11 - 59 U/L  URINALYSIS, ROUTINE W REFLEX MICROSCOPIC     Status: Abnormal   Collection Time   05/07/12  5:40 PM      Component  Value Range   Color, Urine YELLOW  YELLOW   APPearance CLOUDY (*) CLEAR   Specific Gravity, Urine 1.015  1.005 - 1.030   pH 8.5 (*) 5.0 - 8.0   Glucose, UA NEGATIVE  NEGATIVE mg/dL   Hgb urine dipstick NEGATIVE  NEGATIVE   Bilirubin Urine NEGATIVE  NEGATIVE   Ketones, ur NEGATIVE  NEGATIVE mg/dL   Protein, ur NEGATIVE  NEGATIVE mg/dL   Urobilinogen, UA 2.0 (*) 0.0 - 1.0 mg/dL   Nitrite NEGATIVE  NEGATIVE   Leukocytes, UA NEGATIVE  NEGATIVE    Imaging:  US Abdomen Limited Ruq  05/07/2012  *RADIOLOGY REPORT*  Clinical Data:  Right upper quadrant pain and vomiting.  LIMITED ABDOMINAL ULTRASOUND - RIGHT UPPER QUADRANT  Comparison:  CT of 08/21/2010  Findings:  Gallbladder:  Stone filled gallbladder.  Mobile.  No wall thickening, pericholecystic fluid.  The technologist describes tenderness with gallbladder palpation.  Common bile duct:  Mildly dilated for age.  Maximally 8 mm.  No common duct stone identified.  Liver:  Normal in echogenicity, without focal lesion.  No intrahepatic biliary ductal dilatation.  IMPRESSION:  1.  Cholelithiasis.  No wall thickening or pericholecystic fluid. The technologist describes tenderness with gallbladder palpation. Therefore, acute cholecystitis cannot be excluded. 2.  Mild common duct dilatation for age.  Cannot exclude choledocholithiasis.  If the bilirubin levels are elevated and/or there is a clinical concern of choledocholithiasis, the test of choice is MRCP.  These results will be called to the ordering clinician or representative by the Radiologist Assistant, and communication documented in the PACS Dashboard.                    Original Report Authenticated By: Consuello Bossier, M.D.    ED Course Pain and N/V resolved w/ Dilaudid and phenergan respectively. UC's resolved w/ Terb.  Assessment: 1. Cholelithiasis   2. Preterm uterine contractions, antepartum  Plan: Discharge home per consult w/ Dr. Gaynell Face Preterm labor precautions. Low fat diet  reviewed. Plan outpt visit w/ dietician.      Follow-up Information    Follow up with MARSHALL,BERNARD A, MD. (as scheduled)    Contact information:   [address] [place] [address] [phone]       Follow up with [facility] [place] MATERNITY ADMISSIONS. (As needed if symptoms worsen)    Contact information:   [address] [id number] mc [place] [address] [phone]          Medication List     As of 05/07/2012  9:34 PM    TAKE these medications         cyclobenzaprine 10 MG tablet   Commonly known as: FLEXERIL   Take 1 tablet (10 mg total) by mouth 3 (three) times daily as needed for muscle spasms.      oxyCODONE-acetaminophen 5-500 MG per capsule   Commonly known as: TYLOX   Take 1-2 capsules by mouth every 6 (six) hours as needed for pain.      Prenatal Vitamins (DIS) Tabs   Take 1 tablet by mouth daily.         Shelby Nichols, CNM 05/07/2012 9:34 PM

## 2012-07-22 LAB — OB RESULTS CONSOLE GBS: GBS: NEGATIVE

## 2012-07-24 NOTE — L&D Delivery Note (Signed)
Delivery Note At 12:44 AM a viable female was delivered via Vaginal, Spontaneous Delivery (Presentation: Left Occiput Anterior).  APGAR: 9, 9; weight .   Placenta status: Intact, Spontaneous.  Cord: 3 vessels with the following complications: None.  Cord pH: none  Anesthesia: Epidural  Episiotomy: None Lacerations: None Suture Repair: none Est. Blood Loss (mL): 300  Mom to postpartum.  Baby to nursery-stable.  HARPER,CHARLES A 08/04/2012, 1:20 AM

## 2012-08-03 ENCOUNTER — Encounter (HOSPITAL_COMMUNITY): Payer: Self-pay | Admitting: Anesthesiology

## 2012-08-03 ENCOUNTER — Inpatient Hospital Stay (HOSPITAL_COMMUNITY)
Admission: AD | Admit: 2012-08-03 | Discharge: 2012-08-06 | DRG: 775 | Disposition: A | Discharge status: Home / Self Care | Payer: Medicaid Other | Source: Ambulatory Visit | Attending: Obstetrics | Admitting: Obstetrics

## 2012-08-03 ENCOUNTER — Inpatient Hospital Stay (HOSPITAL_COMMUNITY): Payer: Medicaid Other | Admitting: Anesthesiology

## 2012-08-03 ENCOUNTER — Inpatient Hospital Stay (HOSPITAL_COMMUNITY)
Admission: AD | Admit: 2012-08-03 | Discharge: 2012-08-03 | Disposition: A | Discharge status: Home / Self Care | Payer: Medicaid Other | Source: Ambulatory Visit | Attending: Obstetrics | Admitting: Obstetrics

## 2012-08-03 ENCOUNTER — Encounter (HOSPITAL_COMMUNITY): Payer: Self-pay | Admitting: Obstetrics

## 2012-08-03 ENCOUNTER — Encounter (HOSPITAL_COMMUNITY): Payer: Self-pay | Admitting: *Deleted

## 2012-08-03 DIAGNOSIS — O479 False labor, unspecified: Secondary | ICD-10-CM | POA: Insufficient documentation

## 2012-08-03 LAB — CBC
Hemoglobin: 12.5 g/dL (ref 12.0–15.0)
MCH: 31 pg (ref 26.0–34.0)
MCHC: 34.7 g/dL (ref 30.0–36.0)
Platelets: 248 10*3/uL (ref 150–400)
RDW: 12.6 % (ref 11.5–15.5)

## 2012-08-03 MED ORDER — OXYTOCIN BOLUS FROM INFUSION
500.0000 mL | INTRAVENOUS | Status: DC
  Administered 2012-08-04: 500 mL via INTRAVENOUS

## 2012-08-03 MED ORDER — LIDOCAINE HCL (PF) 1 % IJ SOLN
INTRAMUSCULAR | Status: DC | PRN
  Administered 2012-08-03 (×2): 4 mL

## 2012-08-03 MED ORDER — EPHEDRINE 5 MG/ML INJ
10.0000 mg | INTRAVENOUS | Status: DC | PRN
  Filled 2012-08-03: qty 4

## 2012-08-03 MED ORDER — EPHEDRINE 5 MG/ML INJ: 10.0000 mg | INTRAVENOUS | Status: DC | PRN

## 2012-08-03 MED ORDER — LACTATED RINGERS IV SOLN: 500.0000 mL | Freq: Once | INTRAVENOUS | Status: DC

## 2012-08-03 MED ORDER — IBUPROFEN 600 MG PO TABS: 600.0000 mg | ORAL_TABLET | Freq: Four times a day (QID) | ORAL | Status: DC | PRN

## 2012-08-03 MED ORDER — FENTANYL 2.5 MCG/ML BUPIVACAINE 1/10 % EPIDURAL INFUSION (WH - ANES)
14.0000 mL/h | INTRAMUSCULAR | Status: DC
  Filled 2012-08-03: qty 125

## 2012-08-03 MED ORDER — OXYTOCIN 40 UNITS IN LACTATED RINGERS INFUSION - SIMPLE MED
62.5000 mL/h | INTRAVENOUS | Status: DC
  Filled 2012-08-03: qty 1000

## 2012-08-03 MED ORDER — DIPHENHYDRAMINE HCL 50 MG/ML IJ SOLN: 12.5000 mg | INTRAMUSCULAR | Status: DC | PRN

## 2012-08-03 MED ORDER — OXYCODONE-ACETAMINOPHEN 5-325 MG PO TABS: 1.0000 | ORAL_TABLET | ORAL | Status: DC | PRN

## 2012-08-03 MED ORDER — PHENYLEPHRINE 40 MCG/ML (10ML) SYRINGE FOR IV PUSH (FOR BLOOD PRESSURE SUPPORT): 80.0000 ug | PREFILLED_SYRINGE | INTRAVENOUS | Status: DC | PRN

## 2012-08-03 MED ORDER — ACETAMINOPHEN 325 MG PO TABS: 650.0000 mg | ORAL_TABLET | ORAL | Status: DC | PRN

## 2012-08-03 MED ORDER — LACTATED RINGERS IV SOLN
500.0000 mL | INTRAVENOUS | Status: DC | PRN
  Administered 2012-08-04: 300 mL via INTRAVENOUS

## 2012-08-03 MED ORDER — ONDANSETRON HCL 4 MG/2ML IJ SOLN: 4.0000 mg | Freq: Four times a day (QID) | INTRAMUSCULAR | Status: DC | PRN

## 2012-08-03 MED ORDER — LACTATED RINGERS IV SOLN: INTRAVENOUS | Status: DC

## 2012-08-03 MED ORDER — LIDOCAINE HCL (PF) 1 % IJ SOLN
30.0000 mL | INTRAMUSCULAR | Status: DC | PRN
  Filled 2012-08-03: qty 30

## 2012-08-03 MED ORDER — CITRIC ACID-SODIUM CITRATE 334-500 MG/5ML PO SOLN: 30.0000 mL | ORAL | Status: DC | PRN

## 2012-08-03 MED ORDER — SODIUM CHLORIDE 0.9 % IV SOLN
2.0000 g | Freq: Once | INTRAVENOUS | Status: AC
  Administered 2012-08-03: 2 g via INTRAVENOUS
  Filled 2012-08-03: qty 2000

## 2012-08-03 MED ORDER — TERBUTALINE SULFATE 1 MG/ML IJ SOLN: 0.2500 mg | Freq: Once | INTRAMUSCULAR | Status: AC | PRN

## 2012-08-03 MED ORDER — OXYTOCIN 40 UNITS IN LACTATED RINGERS INFUSION - SIMPLE MED
1.0000 m[IU]/min | INTRAVENOUS | Status: DC
  Administered 2012-08-03: 1 m[IU]/min via INTRAVENOUS

## 2012-08-03 MED ORDER — PHENYLEPHRINE 40 MCG/ML (10ML) SYRINGE FOR IV PUSH (FOR BLOOD PRESSURE SUPPORT)
80.0000 ug | PREFILLED_SYRINGE | INTRAVENOUS | Status: DC | PRN
  Administered 2012-08-04: 80 ug via INTRAVENOUS
  Filled 2012-08-03: qty 5

## 2012-08-03 MED ORDER — LACTATED RINGERS IV SOLN
INTRAVENOUS | Status: DC
  Administered 2012-08-04: via INTRAVENOUS

## 2012-08-03 MED ORDER — FENTANYL 2.5 MCG/ML BUPIVACAINE 1/10 % EPIDURAL INFUSION (WH - ANES)
INTRAMUSCULAR | Status: DC | PRN
  Administered 2012-08-03: 11.5 mL/h via EPIDURAL

## 2012-08-03 NOTE — MAU Note (Signed)
Pt states that she started contracting this morning around 10. States she noticed some bloody show today and decided she needed to have her cervix checked

## 2012-08-03 NOTE — Anesthesia Procedure Notes (Signed)
Epidural Patient location during procedure: OB Start time: 08/03/2012 11:10 PM  Staffing Anesthesiologist: Meshell Abdulaziz A. Performed by: anesthesiologist   Preanesthetic Checklist Completed: patient identified, site marked, surgical consent, pre-op evaluation, timeout performed, IV checked, risks and benefits discussed and monitors and equipment checked  Epidural Patient position: sitting Prep: site prepped and draped and DuraPrep Patient monitoring: continuous pulse ox and blood pressure Approach: midline Injection technique: LOR air  Needle:  Needle type: Tuohy  Needle gauge: 17 G Needle length: 9 cm and 9 Needle insertion depth: 4 cm Catheter type: closed end flexible Catheter size: 19 Gauge Catheter at skin depth: 9 cm Test dose: negative and Other  Assessment Events: blood not aspirated, injection not painful, no injection resistance, negative IV test and no paresthesia  Additional Notes Patient identified. Risks and benefits discussed including failed block, incomplete  Pain control, post dural puncture headache, nerve damage, paralysis, blood pressure Changes, nausea, vomiting, reactions to medications-both toxic and allergic and post Partum back pain. All questions were answered. Patient expressed understanding and wished to proceed. Sterile technique was used throughout procedure. Epidural site was Dressed with sterile barrier dressing. No paresthesias, signs of intravascular injection Or signs of intrathecal spread were encountered.  Patient was more comfortable after the epidural was dosed. Please see RN's note for documentation of vital signs and FHR which are stable.

## 2012-08-03 NOTE — H&P (Signed)
Shelby Nichols is a 22 y.o. female presenting for UC's. Maternal Medical History:  Reason for admission: Reason for admission: contractions.  22 yo G5 P4.  EDC 08-15-12.  Presents with UC's.  Contractions: Onset was 6-12 hours ago.   Frequency: regular.    Fetal activity: Perceived fetal activity is normal.   Last perceived fetal movement was within the past hour.    Prenatal complications: no prenatal complications Prenatal Complications - Diabetes: none.    OB History    Grav Para Term Preterm Abortions TAB SAB Ect Mult Living   5 4 4  0 0 0 0 0 0 4     Past Medical History  Diagnosis Date  . Trichomonas 04/25/2011  . Chlamydia    Past Surgical History  Procedure Date  . Vaginal delivery    Family History: family history is not on file. Social History:  reports that she has been smoking.  She has never used smokeless tobacco. She reports that she drinks alcohol. She reports that she does not use illicit drugs.   Prenatal Transfer Tool  Maternal Diabetes: No Genetic Screening: Normal Maternal Ultrasounds/Referrals: Normal Fetal Ultrasounds or other Referrals:  None Maternal Substance Abuse:  No Significant Maternal Medications:  Meds include: Other: PNV's Significant Maternal Lab Results:  Lab values include: Other:  GBS status unknown Other Comments:  None  Review of Systems  All other systems reviewed and are negative.    Dilation: 7 Effacement (%): 80 Station: -2 Exam by:: M.Topp,RN Blood pressure 113/57, pulse 103, temperature 98.1 F (36.7 C), resp. rate 22, height 5' (1.524 m), weight 178 lb 6.4 oz (80.922 kg), last menstrual period 10/09/2011, SpO2 100.00%. Maternal Exam:  Uterine Assessment: Contraction strength is firm.  Contraction frequency is regular.   Abdomen: Patient reports no abdominal tenderness. Fetal presentation: vertex  Pelvis: adequate for delivery.   Cervix: Cervix evaluated by digital exam.     Physical Exam  Nursing note and  vitals reviewed. Constitutional: She is oriented to person, place, and time. She appears well-developed and well-nourished.  HENT:  Head: Normocephalic and atraumatic.  Eyes: Conjunctivae normal are normal. Pupils are equal, round, and reactive to light.  Neck: Normal range of motion. Neck supple.  Cardiovascular: Normal rate.   Respiratory: Effort normal.  GI: Soft.  Neurological: She is alert and oriented to person, place, and time.  Skin: Skin is warm.  Psychiatric: She has a normal mood and affect. Her behavior is normal. Judgment and thought content normal.    Prenatal labs: ABO, Rh:   Antibody:   Rubella:   RPR:    HBsAg:    HIV:    GBS:     Assessment/Plan: 38 weeks.  Active labor.  Expectant.   HARPER,CHARLES A 08/03/2012, 10:20 PM

## 2012-08-03 NOTE — Progress Notes (Signed)
States has not felt baby move since left hosp earlier today

## 2012-08-03 NOTE — Progress Notes (Signed)
Dr Clearance Coots notified of pt's VE, FHR pattern, and contraction pattern, orders received

## 2012-08-03 NOTE — MAU Note (Signed)
I've had ctxs all day. Was here earlier and cervix closed. Went home and ctxs got closer and having some vag bleeding.

## 2012-08-03 NOTE — Progress Notes (Signed)
Shelby Nichols is a 22 y.o. G5P4004 at [redacted]w[redacted]d by LMP admitted for active labor  Subjective:   Objective: BP 119/67  Pulse 100  Temp 98.6 F (37 C) (Oral)  Resp 22  Ht 5' (1.524 m)  Wt 178 lb 6.4 oz (80.922 kg)  BMI 34.84 kg/m2  SpO2 100%  LMP 10/09/2011      FHT:  FHR: 150 bpm, variability: moderate,  accelerations:  Present,  decelerations:  Absent UC:   regular, every 3-4 minutes SVE:   Dilation: 7 Effacement (%): 80 Station: -2 Exam by:: Dr. Clearance Coots   Labs: Lab Results  Component Value Date   WBC 13.4* 08/03/2012   HGB 12.5 08/03/2012   HCT 36.0 08/03/2012   MCV 89.3 08/03/2012   PLT 248 08/03/2012    Assessment / Plan: Spontaneous labor, progressing normally  Labor: Progressing normally Preeclampsia:  n/a Fetal Wellbeing:  Category I Pain Control:  Epidural I/D:  n/a Anticipated MOD:  NSVD  HARPER,CHARLES A 08/03/2012, 10:52 PM

## 2012-08-03 NOTE — Anesthesia Preprocedure Evaluation (Signed)
Anesthesia Evaluation  Patient identified by MRN, date of birth, ID band Patient awake    Reviewed: Allergy & Precautions, H&P , Patient's Chart, lab work & pertinent test results  Airway Mallampati: III TM Distance: >3 FB Neck ROM: full    Dental No notable dental hx. (+) Teeth Intact   Pulmonary Current Smoker,  breath sounds clear to auscultation  Pulmonary exam normal       Cardiovascular negative cardio ROS  Rhythm:regular Rate:Normal     Neuro/Psych negative neurological ROS  negative psych ROS   GI/Hepatic negative GI ROS, Neg liver ROS,   Endo/Other  negative endocrine ROS  Renal/GU negative Renal ROS  negative genitourinary   Musculoskeletal   Abdominal Normal abdominal exam  (+)   Peds  Hematology negative hematology ROS (+)   Anesthesia Other Findings   Reproductive/Obstetrics (+) Pregnancy                           Anesthesia Physical Anesthesia Plan  ASA: II  Anesthesia Plan: Epidural   Post-op Pain Management:    Induction:   Airway Management Planned:   Additional Equipment:   Intra-op Plan:   Post-operative Plan:   Informed Consent: I have reviewed the patients History and Physical, chart, labs and discussed the procedure including the risks, benefits and alternatives for the proposed anesthesia with the patient or authorized representative who has indicated his/her understanding and acceptance.     Plan Discussed with: Anesthesiologist  Anesthesia Plan Comments:         Anesthesia Quick Evaluation

## 2012-08-04 ENCOUNTER — Encounter (HOSPITAL_COMMUNITY): Payer: Self-pay | Admitting: Family Medicine

## 2012-08-04 LAB — CBC
HCT: 30.8 % — ABNORMAL LOW (ref 36.0–46.0)
MCV: 90.1 fL (ref 78.0–100.0)
Platelets: 244 10*3/uL (ref 150–400)
RBC: 3.42 MIL/uL — ABNORMAL LOW (ref 3.87–5.11)
WBC: 15.4 10*3/uL — ABNORMAL HIGH (ref 4.0–10.5)

## 2012-08-04 LAB — RPR: RPR Ser Ql: NONREACTIVE

## 2012-08-04 MED ORDER — INFLUENZA VIRUS VACC SPLIT PF IM SUSP
0.5000 mL | INTRAMUSCULAR | Status: AC
  Administered 2012-08-04: 0.5 mL via INTRAMUSCULAR

## 2012-08-04 MED ORDER — IBUPROFEN 600 MG PO TABS
600.0000 mg | ORAL_TABLET | Freq: Four times a day (QID) | ORAL | Status: DC
  Administered 2012-08-04 – 2012-08-06 (×8): 600 mg via ORAL
  Filled 2012-08-04 (×9): qty 1

## 2012-08-04 MED ORDER — TETANUS-DIPHTH-ACELL PERTUSSIS 5-2.5-18.5 LF-MCG/0.5 IM SUSP
0.5000 mL | Freq: Once | INTRAMUSCULAR | Status: AC
  Administered 2012-08-04: 0.5 mL via INTRAMUSCULAR
  Filled 2012-08-04: qty 0.5

## 2012-08-04 MED ORDER — LANOLIN HYDROUS EX OINT: TOPICAL_OINTMENT | CUTANEOUS | Status: DC | PRN

## 2012-08-04 MED ORDER — ONDANSETRON HCL 4 MG PO TABS: 4.0000 mg | ORAL_TABLET | ORAL | Status: DC | PRN

## 2012-08-04 MED ORDER — SODIUM CHLORIDE 0.9 % IJ SOLN
3.0000 mL | Freq: Three times a day (TID) | INTRAMUSCULAR | Status: DC
  Administered 2012-08-04 (×2): 3 mL via INTRAVENOUS

## 2012-08-04 MED ORDER — SIMETHICONE 80 MG PO CHEW: 80.0000 mg | CHEWABLE_TABLET | ORAL | Status: DC | PRN

## 2012-08-04 MED ORDER — OXYTOCIN 40 UNITS IN LACTATED RINGERS INFUSION - SIMPLE MED: 62.5000 mL/h | INTRAVENOUS | Status: DC | PRN

## 2012-08-04 MED ORDER — ONDANSETRON HCL 4 MG/2ML IJ SOLN: 4.0000 mg | INTRAMUSCULAR | Status: DC | PRN

## 2012-08-04 MED ORDER — DIBUCAINE 1 % RE OINT: 1.0000 "application " | TOPICAL_OINTMENT | RECTAL | Status: DC | PRN

## 2012-08-04 MED ORDER — PRENATAL MULTIVITAMIN CH
1.0000 | ORAL_TABLET | Freq: Every day | ORAL | Status: DC
  Administered 2012-08-04 – 2012-08-06 (×3): 1 via ORAL
  Filled 2012-08-04 (×3): qty 1

## 2012-08-04 MED ORDER — ZOLPIDEM TARTRATE 5 MG PO TABS: 5.0000 mg | ORAL_TABLET | Freq: Every evening | ORAL | Status: DC | PRN

## 2012-08-04 MED ORDER — DIPHENHYDRAMINE HCL 25 MG PO CAPS: 25.0000 mg | ORAL_CAPSULE | Freq: Four times a day (QID) | ORAL | Status: DC | PRN

## 2012-08-04 MED ORDER — MEDROXYPROGESTERONE ACETATE 150 MG/ML IM SUSP: 150.0000 mg | INTRAMUSCULAR | Status: DC | PRN

## 2012-08-04 MED ORDER — OXYCODONE-ACETAMINOPHEN 5-325 MG PO TABS
1.0000 | ORAL_TABLET | ORAL | Status: DC | PRN
  Administered 2012-08-04 – 2012-08-05 (×3): 1 via ORAL
  Administered 2012-08-05: 2 via ORAL
  Administered 2012-08-05: 1 via ORAL
  Administered 2012-08-06 (×2): 2 via ORAL
  Filled 2012-08-04: qty 2
  Filled 2012-08-04 (×2): qty 1
  Filled 2012-08-04: qty 2
  Filled 2012-08-04 (×2): qty 1
  Filled 2012-08-04: qty 2

## 2012-08-04 MED ORDER — SENNOSIDES-DOCUSATE SODIUM 8.6-50 MG PO TABS
2.0000 | ORAL_TABLET | Freq: Every day | ORAL | Status: DC
  Administered 2012-08-04 – 2012-08-05 (×2): 2 via ORAL

## 2012-08-04 MED ORDER — WITCH HAZEL-GLYCERIN EX PADS: 1.0000 "application " | MEDICATED_PAD | CUTANEOUS | Status: DC | PRN

## 2012-08-04 MED ORDER — BENZOCAINE-MENTHOL 20-0.5 % EX AERO
1.0000 "application " | INHALATION_SPRAY | CUTANEOUS | Status: DC | PRN
  Administered 2012-08-04: 1 via TOPICAL
  Filled 2012-08-04: qty 56

## 2012-08-04 NOTE — Anesthesia Postprocedure Evaluation (Signed)
  Anesthesia Post-op Note  Patient: Shelby Nichols  Procedure(s) Performed: * No procedures listed *  Patient Location: Mother/Baby  Anesthesia Type:Epidural  Level of Consciousness: awake, alert  and oriented  Airway and Oxygen Therapy: Patient Spontanous Breathing  Post-op Pain: none  Post-op Assessment: Post-op Vital signs reviewed, Patient's Cardiovascular Status Stable, Respiratory Function Stable, Patent Airway, No signs of Nausea or vomiting, Adequate PO intake, Pain level controlled, No headache, No backache, No residual numbness and No residual motor weakness  Post-op Vital Signs: Reviewed and stable  Complications: No apparent anesthesia complications

## 2012-08-04 NOTE — Progress Notes (Signed)
GICELA SCHWARTING is a 22 y.o. G5P4004 at [redacted]w[redacted]d by LMP admitted for active labor  Subjective:   Objective: BP 133/72  Pulse 100  Temp 98.6 F (37 C) (Oral)  Resp 18  Ht 5' (1.524 m)  Wt 178 lb 6.4 oz (80.922 kg)  BMI 34.84 kg/m2  SpO2 100%  LMP 10/09/2011   Total I/O In: -  Out: 700 [Urine:400; Blood:300]  FHT:  FHR: 150 bpm, variability: moderate,  accelerations:  Present,  decelerations:  Absent UC:   regular, every 2 minutes SVE:   Dilation: 10 Effacement (%): 80 Station: +2 Exam by:: T.Sprague RN  Labs: Lab Results  Component Value Date   WBC 13.4* 08/03/2012   HGB 12.5 08/03/2012   HCT 36.0 08/03/2012   MCV 89.3 08/03/2012   PLT 248 08/03/2012    Assessment / Plan: Spontaneous labor, progressing normally  Labor: Progressing normally Preeclampsia:  n/a Fetal Wellbeing:  Category I Pain Control:  Epidural I/D:  n/a Anticipated MOD:  NSVD  Aamori Mcmasters A 08/04/2012, 1:18 AM

## 2012-08-04 NOTE — Progress Notes (Signed)
Post Partum Day 0 Subjective: no complaints  Objective: Blood pressure 129/83, pulse 113, temperature 99.6 F (37.6 C), temperature source Oral, resp. rate 18, height 5' (1.524 m), weight 178 lb 6.4 oz (80.922 kg), last menstrual period 10/09/2011, SpO2 100.00%, unknown if currently breastfeeding.  Physical Exam:  General: alert and no distress Lochia: appropriate Uterine Fundus: firm Incision: none DVT Evaluation: No evidence of DVT seen on physical exam.   Basename 08/04/12 0520 08/03/12 2211  HGB 10.3* 12.5  HCT 30.8* 36.0    Assessment/Plan: Doing well.  Routine.  Wants PPTL.  Keep epidural.  Will discuss with Dr. Gaynell Face in a.m.  NPO after MN.   LOS: 1 day   Shelby Nichols A 08/04/2012, 7:43 AM

## 2012-08-05 MED ORDER — LACTATED RINGERS IV SOLN
INTRAVENOUS | Status: DC
  Administered 2012-08-05: 07:00:00 via INTRAVENOUS

## 2012-08-05 MED ORDER — FAMOTIDINE 20 MG PO TABS: 40.0000 mg | ORAL_TABLET | Freq: Once | ORAL | Status: DC

## 2012-08-05 MED ORDER — METOCLOPRAMIDE HCL 10 MG PO TABS: 10.0000 mg | ORAL_TABLET | Freq: Once | ORAL | Status: DC

## 2012-08-05 NOTE — Addendum Note (Signed)
Addendum  created 08/05/12 1733 by Shanon Payor, CRNA   Modules edited:Charges VN, Notes Section

## 2012-08-05 NOTE — Progress Notes (Signed)
Ur chart review completed.  

## 2012-08-05 NOTE — Addendum Note (Signed)
Addendum  created 08/05/12 1733 by Delmi Fulfer M Arif Amendola, CRNA   Modules edited:Charges VN, Notes Section    

## 2012-08-05 NOTE — Clinical Social Work Maternal (Signed)
    Clinical Social Work Department PSYCHOSOCIAL ASSESSMENT - MATERNAL/CHILD 08/05/2012  Patient:  Shelby Nichols, Shelby Nichols  Account Number:  [id number]  Admit Date:  08/03/2012  Marjo Bicker Name:   Dede Query    Clinical Social Worker:  Nobie Putnam, LCSW   Date/Time:  08/05/2012 01:05 PM  Date Referred:  08/05/2012   Referral source  CN     Referred reason  Behavioral Health Issues   Other referral source:    I:  FAMILY / HOME ENVIRONMENT Child's legal guardian:  PARENT  Guardian - Name Guardian - Age Guardian - Address  Shelby Nichols [address] [place], [place] [address]  Addison Lank [address] [place]   Other household support members/support persons Name Relationship DOB   DAUGHTER 2007   SON 2008   DAUGHTER 2010   SON 2011   Other support:    II  PSYCHOSOCIAL DATA Information Source:  Patient Interview  Event organiser Employment:   Financial resources:  Medicaid If Medicaid - County:  [place] Other  Forensic psychologist / Grade:   Maternity Care Coordinator / Child Services Coordination / Early Interventions:  Cultural issues impacting care:    III  STRENGTHS Strengths  Adequate Resources  Home prepared for Child (including basic supplies)  Supportive family/friends   Strength comment:    IV  RISK FACTORS AND CURRENT PROBLEMS Current Problem:  YES   Risk Factor & Current Problem Patient Issue Family Issue Risk Factor / Current Problem Comment  Mental Illness Y N Hx of PP depression    V  SOCIAL WORK ASSESSMENT Pt remembers feeling depressed, crying a lot, isolating and decreased appetite, after the birth of her son in 2011.  Pt's were not treated with medication or therapy, as she told CSW symptoms didn't last long.  She denies any history of SI/HI.  According to the pt, she has not felt depressed since then.  FOB is at the bedside, aware of pt's history and supportive.  She has majority of infant supplies & has  requested to purchase a car seat.  CSW will make referral to volunteer services.  CSW provided pt with PP depression literature and encouraged her to seek medical attention if needed.      VI SOCIAL WORK PLAN Social Work Plan  No Further Intervention Required / No Barriers to Discharge   Type of pt/family education:   If child protective services report - county:   If child protective services report - date:   Information/referral to community resources comment:   Other social work plan:

## 2012-08-05 NOTE — Anesthesia Postprocedure Evaluation (Signed)
  Anesthesia Post-op Note  Patient: Shelby Nichols  Procedure(s) Performed: * No procedures listed *  Patient Location: Mother/Baby  Anesthesia Type:Epidural  Level of Consciousness: awake, alert  and oriented  Airway and Oxygen Therapy: Patient Spontanous Breathing  Post-op Pain: mild  Post-op Assessment: Post-op Vital signs reviewed, Patient's Cardiovascular Status Stable, No headache, No backache, No residual numbness and No residual motor weakness  Post-op Vital Signs: Reviewed and stable  Complications: No apparent anesthesia complications

## 2012-08-05 NOTE — Progress Notes (Signed)
Patient ID: Shelby Nichols, female   DOB: 1991/04/11, 22 y.o.   MRN: [id number] Postpartum day one Vital signs normal Fundus firm No complaints

## 2012-08-06 NOTE — Discharge Summary (Signed)
Obstetric Discharge Summary Reason for Admission: onset of labor Prenatal Procedures: none Intrapartum Procedures: spontaneous vaginal delivery Postpartum Procedures: none Complications-Operative and Postpartum: none Hemoglobin  Date Value Range Status  08/04/2012 10.3* 12.0 - 15.0 g/dL Final     REPEATED TO VERIFY     DELTA CHECK NOTED     HCT  Date Value Range Status  08/04/2012 30.8* 36.0 - 46.0 % Final    Physical Exam:  General: alert Lochia: appropriate Uterine Fundus: firm Incision: healing well DVT Evaluation: No evidence of DVT seen on physical exam.  Discharge Diagnoses: Term Pregnancy-delivered  Discharge Information: Date: 08/06/2012 Activity: pelvic rest Diet: routine Medications: Percocet Condition: stable Instructions: refer to practice specific booklet Discharge to: home Follow-up Information    Schedule an appointment as soon as possible for a visit in 6 weeks to follow up.         Newborn Data: Live born female  Birth Weight: 5 lb 14.2 oz (2671 g) APGAR: 9, 9  Home with mother.  Kyrie Bun A 08/06/2012, 6:53 AM

## 2012-08-06 NOTE — Progress Notes (Signed)
Called Dr. Elsie Stain office to clarify Rubella status. In pts 2011 prenatal record she is listed as rubella immune and in her labs that were manually entered this pregnancy she is listed rubella non immune. In her prenatal record at the Dr. Elsie Stain office she is listed as rubella immune and would not need the MMR vaccine. I have placed a copy of the lab work in her shadow chart.

## 2014-05-25 ENCOUNTER — Encounter (HOSPITAL_COMMUNITY): Payer: Self-pay | Admitting: Family Medicine

## 2014-11-01 ENCOUNTER — Encounter (HOSPITAL_COMMUNITY): Payer: Self-pay | Admitting: Emergency Medicine

## 2014-11-01 ENCOUNTER — Emergency Department (HOSPITAL_COMMUNITY)
Admission: EM | Admit: 2014-11-01 | Discharge: 2014-11-01 | Disposition: A | Discharge status: Home / Self Care | Payer: Medicaid Other | Attending: Emergency Medicine | Admitting: Emergency Medicine

## 2014-11-01 DIAGNOSIS — Y9289 Other specified places as the place of occurrence of the external cause: Secondary | ICD-10-CM | POA: Insufficient documentation

## 2014-11-01 DIAGNOSIS — Z8619 Personal history of other infectious and parasitic diseases: Secondary | ICD-10-CM | POA: Insufficient documentation

## 2014-11-01 DIAGNOSIS — L03115 Cellulitis of right lower limb: Secondary | ICD-10-CM | POA: Insufficient documentation

## 2014-11-01 DIAGNOSIS — W57XXXA Bitten or stung by nonvenomous insect and other nonvenomous arthropods, initial encounter: Secondary | ICD-10-CM | POA: Insufficient documentation

## 2014-11-01 DIAGNOSIS — Y9389 Activity, other specified: Secondary | ICD-10-CM | POA: Insufficient documentation

## 2014-11-01 DIAGNOSIS — Y998 Other external cause status: Secondary | ICD-10-CM | POA: Insufficient documentation

## 2014-11-01 DIAGNOSIS — Z720 Tobacco use: Secondary | ICD-10-CM | POA: Insufficient documentation

## 2014-11-01 MED ORDER — CLINDAMYCIN HCL 150 MG PO CAPS: 450.0000 mg | ORAL_CAPSULE | Freq: Three times a day (TID) | ORAL | Status: DC

## 2014-11-01 MED ORDER — ACETAMINOPHEN 325 MG PO TABS
650.0000 mg | ORAL_TABLET | Freq: Once | ORAL | Status: AC
  Administered 2014-11-01: 650 mg via ORAL
  Filled 2014-11-01: qty 2

## 2014-11-01 NOTE — Discharge Instructions (Signed)
Please take your antibiotic, clindamycin, with a probiotic as recommended by your pharmacist. Please take your antibiotic and its entirety. You may use Tylenol or ibuprofen for pain.   Cellulitis Cellulitis is an infection of the skin and the tissue beneath it. The infected area is usually red and tender. Cellulitis occurs most often in the arms and lower legs.  CAUSES  Cellulitis is caused by bacteria that enter the skin through cracks or cuts in the skin. The most common types of bacteria that cause cellulitis are staphylococci and streptococci. SIGNS AND SYMPTOMS   Redness and warmth.  Swelling.  Tenderness or pain.  Fever. DIAGNOSIS  Your health care provider can usually determine what is wrong based on a physical exam. Blood tests may also be done. TREATMENT  Treatment usually involves taking an antibiotic medicine. HOME CARE INSTRUCTIONS   Take your antibiotic medicine as directed by your health care provider. Finish the antibiotic even if you start to feel better.  Keep the infected arm or leg elevated to reduce swelling.  Apply a warm cloth to the affected area up to 4 times per day to relieve pain.  Take medicines only as directed by your health care provider.  Keep all follow-up visits as directed by your health care provider. SEEK MEDICAL CARE IF:   You notice red streaks coming from the infected area.  Your red area gets larger or turns dark in color.  Your bone or joint underneath the infected area becomes painful after the skin has healed.  Your infection returns in the same area or another area.  You notice a swollen bump in the infected area.  You develop new symptoms.  You have a fever. SEEK IMMEDIATE MEDICAL CARE IF:   You feel very sleepy.  You develop vomiting or diarrhea.  You have a general ill feeling (malaise) with muscle aches and pains. MAKE SURE YOU:   Understand these instructions.  Will watch your condition.  Will get help right  away if you are not doing well or get worse. Document Released: 04/19/2005 Document Revised: 11/24/2013 Document Reviewed: 09/25/2011 [facility] Patient Information 2015 [place], [place]. This information is not intended to replace advice given to you by your health care provider. Make sure you discuss any questions you have with your health care provider.

## 2014-11-01 NOTE — ED Provider Notes (Signed)
CSN: [id number]     Arrival date & time 11/01/14  2012 History   First MD Initiated Contact with Patient 11/01/14 2042     Chief Complaint  Patient presents with  . Insect Bite    Shelby Nichols is a 24 y.o. female who is otherwise healthy who presents to the emergency department complaining of redness and pain to her right posterior thigh for the past. 4 days. Patient reports she believes she was stung or bitten by an insect 3-4 days ago. She reports pain and swelling developed to her right posterior thigh. She reports it has gradually worsened and now her pain is 8 out of 10 and she describes it as a burning. She denies itching. She has taken no treatments today. She denies any other bites on her body. She did not see any bug or insect on her or bite her. She denies history of skin infections. The patient denies fevers, chills, abdominal pain, nausea, vomiting, diarrhea, numbness, tingling or discharge from the area. She denies injury to her thigh.  (Consider location/radiation/quality/duration/timing/severity/associated sxs/prior Treatment) HPI  Past Medical History  Diagnosis Date  . Trichomonas 04/25/2011  . Chlamydia    Past Surgical History  Procedure Laterality Date  . Vaginal delivery     No family history on file. History  Substance Use Topics  . Smoking status: Current Every Day Smoker -- 0.10 packs/day for 3 years  . Smokeless tobacco: Never Used  . Alcohol Use: Yes     Comment: social, not while pregnant   OB History    Gravida Para Term Preterm AB TAB SAB Ectopic Multiple Living   0 0 0 0 0 0 5     Review of Systems  Constitutional: Negative for fever and chills.  HENT: Negative for sore throat and trouble swallowing.   Respiratory: Negative for shortness of breath.   Gastrointestinal: Negative for nausea, vomiting and abdominal pain.  Musculoskeletal: Negative for myalgias, joint swelling and arthralgias.  Skin: Positive for color change and rash.   Neurological: Negative for weakness and numbness.      Allergies  Review of patient's allergies indicates no known allergies.  Home Medications   Prior to Admission medications   Medication Sig Start Date End Date Taking? Authorizing Provider  clindamycin (CLEOCIN) 150 MG capsule Take 3 capsules (450 mg total) by mouth 3 (three) times daily. May dispense as  capsules 11/01/14   Everlene Farrier, PA-C   BP 109/68 mmHg  Pulse 74  Temp(Src) 98.8 F (37.1 C) (Oral)  Resp 18  Ht 5' (1.524 m)  Wt 158 lb 8 oz (71.895 kg)  BMI 30.95 kg/m2  SpO2 99%  LMP 10/26/2014 Physical Exam  Constitutional: She appears well-developed and well-nourished. No distress.  Nontoxic appearing.  HENT:  Head: Normocephalic and atraumatic.  Right Ear: External ear normal.  Left Ear: External ear normal.  Mouth/Throat: No oropharyngeal exudate.  Eyes: Conjunctivae are normal. Pupils are equal, round, and reactive to light. Right eye exhibits no discharge. Left eye exhibits no discharge.  Cardiovascular: Normal rate, regular rhythm, normal heart sounds and intact distal pulses.   Bilateral radial pulses are intact.  Pulmonary/Chest: Effort normal and breath sounds normal. No respiratory distress. She has no wheezes. She has no rales.  Abdominal: Soft. There is no tenderness.  Neurological: She is alert. Coordination normal.  Skin: Skin is warm and dry. No rash noted. She is not diaphoretic. There is erythema.  There is a 5 x  5 cm area of erythema and induration to her right posterior thigh. The area is tender to palpation.  There is no fluctuance or abscess. No drainage. No streaking redness.   Psychiatric: She has a normal mood and affect. Her behavior is normal.  Nursing note and vitals reviewed.   ED Course  Procedures (including critical care time) Labs Review Labs Reviewed - No data to display  Imaging Review No results found.   EKG Interpretation None      Filed Vitals:   11/01/14  2017  BP: 109/68  Pulse: 74  Temp: 98.8 F (37.1 C)  TempSrc: Oral  Resp: 18  Height: 5' (1.524 m)  Weight: 158 lb 8 oz (71.895 kg)  SpO2: 99%     MDM   Meds given in ED:  Medications  acetaminophen (TYLENOL) tablet 650 mg (650 mg Oral Given 11/01/14 2119)    Discharge Medication List as of 11/01/2014  9:22 PM    START taking these medications   Details  clindamycin (CLEOCIN) 150 MG capsule Take 3 capsules (450 mg total) by mouth 3 (three) times daily. May dispense as 150mg  capsules, Starting 11/01/2014, Until Discontinued, Print        Final diagnoses:  Cellulitis of right lower extremity   This is a 24 year old female who presents the emergency department complaining of an area of redness and swelling to her right posterior thigh past 3-4 days. She believes she was bitten by an insect. The patient did not see an insect or any insects on her body. Patient denies any other bites on her body. Patient afebrile nontoxic appearing. Patient does have a 5 x 5 cm area of induration and erythema on her right posterior thigh. There is no streaking redness. There is no abscess or fluctuance. We'll discharge the patient with a prescription for clindamycin. I advised her to take a probiotic while taking this medication. The patient's area was marked with a skin marker. Advised return to the emergency department if the area of redness extends beyond the markings. ,I advised the patient to follow-up with their primary care provider this week. I advised the patient to return to the emergency department with new or worsening symptoms or new concerns. The patient verbalized understanding and agreement with plan.   This patient was discussed with Dr. Micheline Mazeocherty who agrees with assessment and plan.     Everlene FarrierWilliam Kenon Delashmit, PA-C 11/01/14 2132  Toy CookeyMegan Docherty, MD 11/02/14 [phone]

## 2014-11-01 NOTE — ED Notes (Signed)
Pt c/o being stung or bitten by insect to back of R upper leg x 3-4 days ago. Continues to have pain and increased swelling. Denies fever, denies n/v/d

## 2014-11-02 ENCOUNTER — Encounter (HOSPITAL_COMMUNITY): Payer: Self-pay | Admitting: *Deleted

## 2014-11-02 ENCOUNTER — Emergency Department (HOSPITAL_COMMUNITY)
Admission: EM | Admit: 2014-11-02 | Discharge: 2014-11-02 | Disposition: A | Discharge status: Home / Self Care | Payer: Medicaid Other | Attending: Emergency Medicine | Admitting: Emergency Medicine

## 2014-11-02 DIAGNOSIS — L03119 Cellulitis of unspecified part of limb: Secondary | ICD-10-CM

## 2014-11-02 DIAGNOSIS — L02415 Cutaneous abscess of right lower limb: Secondary | ICD-10-CM | POA: Insufficient documentation

## 2014-11-02 DIAGNOSIS — L02419 Cutaneous abscess of limb, unspecified: Secondary | ICD-10-CM

## 2014-11-02 DIAGNOSIS — Z720 Tobacco use: Secondary | ICD-10-CM | POA: Insufficient documentation

## 2014-11-02 DIAGNOSIS — Z792 Long term (current) use of antibiotics: Secondary | ICD-10-CM | POA: Insufficient documentation

## 2014-11-02 DIAGNOSIS — L03115 Cellulitis of right lower limb: Secondary | ICD-10-CM | POA: Insufficient documentation

## 2014-11-02 DIAGNOSIS — Z8619 Personal history of other infectious and parasitic diseases: Secondary | ICD-10-CM | POA: Insufficient documentation

## 2014-11-02 MED ORDER — HYDROCODONE-ACETAMINOPHEN 5-325 MG PO TABS
2.0000 | ORAL_TABLET | Freq: Once | ORAL | Status: AC
  Administered 2014-11-02: 2 via ORAL
  Filled 2014-11-02: qty 2

## 2014-11-02 MED ORDER — HYDROCODONE-ACETAMINOPHEN 5-325 MG PO TABS: 1.0000 | ORAL_TABLET | Freq: Four times a day (QID) | ORAL | Status: DC | PRN

## 2014-11-02 MED ORDER — CLINDAMYCIN HCL 300 MG PO CAPS
300.0000 mg | ORAL_CAPSULE | Freq: Once | ORAL | Status: AC
  Administered 2014-11-02: 300 mg via ORAL
  Filled 2014-11-02: qty 1

## 2014-11-02 MED ORDER — LIDOCAINE-EPINEPHRINE 2 %-1:100000 IJ SOLN
20.0000 mL | Freq: Once | INTRAMUSCULAR | Status: AC
  Administered 2014-11-02: 20 mL
  Filled 2014-11-02: qty 1

## 2014-11-02 NOTE — ED Notes (Signed)
Pt reports R posterior thigh abscess x 5 days.  Was seen here yesterday.  Reports redness is getting bigger and it started to drain today.

## 2014-11-02 NOTE — Discharge Instructions (Signed)
Recommend warm wet compresses or warm water soaks 3-4 times per day to promote drainage. Take 600 mg ibuprofen every 6 hours for pain. You may take Norco as prescribed for severe pain. Be sure to get your antibiotic prescription filled to prevent worsening infection. Return to the emergency department in 2 days if symptoms worsen.  Abscess An abscess is an infected area that contains a collection of pus and debris.It can occur in almost any part of the body. An abscess is also known as a furuncle or boil. CAUSES  An abscess occurs when tissue gets infected. This can occur from blockage of oil or sweat glands, infection of hair follicles, or a minor injury to the skin. As the body tries to fight the infection, pus collects in the area and creates pressure under the skin. This pressure causes pain. People with weakened immune systems have difficulty fighting infections and get certain abscesses more often.  SYMPTOMS Usually an abscess develops on the skin and becomes a painful mass that is red, warm, and tender. If the abscess forms under the skin, you may feel a moveable soft area under the skin. Some abscesses break open (rupture) on their own, but most will continue to get worse without care. The infection can spread deeper into the body and eventually into the bloodstream, causing you to feel ill.  DIAGNOSIS  Your caregiver will take your medical history and perform a physical exam. A sample of fluid may also be taken from the abscess to determine what is causing your infection. TREATMENT  Your caregiver may prescribe antibiotic medicines to fight the infection. However, taking antibiotics alone usually does not cure an abscess. Your caregiver may need to make a small cut (incision) in the abscess to drain the pus. In some cases, gauze is packed into the abscess to reduce pain and to continue draining the area. HOME CARE INSTRUCTIONS   Only take over-the-counter or prescription medicines for pain,  discomfort, or fever as directed by your caregiver.  If you were prescribed antibiotics, take them as directed. Finish them even if you start to feel better.  If gauze is used, follow your caregiver's directions for changing the gauze.  To avoid spreading the infection:  Keep your draining abscess covered with a bandage.  Wash your hands well.  Do not share personal care items, towels, or whirlpools with others.  Avoid skin contact with others.  Keep your skin and clothes clean around the abscess.  Keep all follow-up appointments as directed by your caregiver. SEEK MEDICAL CARE IF:   You have increased pain, swelling, redness, fluid drainage, or bleeding.  You have muscle aches, chills, or a general ill feeling.  You have a fever. MAKE SURE YOU:   Understand these instructions.  Will watch your condition.  Will get help right away if you are not doing well or get worse. Document Released: 04/19/2005 Document Revised: 01/09/2012 Document Reviewed: 09/22/2011 [facility] Patient Information 2015 [place], [place]. This information is not intended to replace advice given to you by your health care provider. Make sure you discuss any questions you have with your health care provider.  Abscess Care After An abscess (also called a boil or furuncle) is an infected area that contains a collection of pus. Signs and symptoms of an abscess include pain, tenderness, redness, or hardness, or you may feel a moveable soft area under your skin. An abscess can occur anywhere in the body. The infection may spread to surrounding tissues causing cellulitis. A  cut (incision) by the surgeon was made over your abscess and the pus was drained out. Gauze may have been packed into the space to provide a drain that will allow the cavity to heal from the inside outwards. The boil may be painful for 5 to 7 days. Most people with a boil do not have high fevers. Your abscess, if seen early, may not have localized,  and may not have been lanced. If not, another appointment may be required for this if it does not get better on its own or with medications. HOME CARE INSTRUCTIONS   Only take over-the-counter or prescription medicines for pain, discomfort, or fever as directed by your caregiver.  When you bathe, soak and then remove gauze or iodoform packs at least daily or as directed by your caregiver. You may then wash the wound gently with mild soapy water. Repack with gauze or do as your caregiver directs. SEEK IMMEDIATE MEDICAL CARE IF:   You develop increased pain, swelling, redness, drainage, or bleeding in the wound site.  You develop signs of generalized infection including muscle aches, chills, fever, or a general ill feeling.  An oral temperature above 102 F (38.9 C) develops, not controlled by medication. See your caregiver for a recheck if you develop any of the symptoms described above. If medications (antibiotics) were prescribed, take them as directed. Document Released: 01/26/2005 Document Revised: 10/02/2011 Document Reviewed: 09/23/2007 [facility] Patient Information 2015 [place], [place]. This information is not intended to replace advice given to you by your health care provider. Make sure you discuss any questions you have with your health care provider.

## 2014-11-02 NOTE — ED Provider Notes (Signed)
CSN: [id number]     Arrival date & time 11/02/14  1924 History  This chart was scribed for non-physician practitioner, Antony Madura, PA-C,working with Doug Sou, MD, by Karle Plumber, ED Scribe. This patient was seen in room WTR9/WTR9 and the patient's care was started at 8:22 PM.  Chief Complaint  Patient presents with  . Abscess   The history is provided by the patient and medical records. No language interpreter was used.    HPI Comments:  Shelby Nichols is a 24 y.o. female who presents to the Emergency Department complaining of an abscess to the right posterior thigh that began 5 days ago. Pt was seen approximately 24 hours ago and was prescribed Clindamycin in which she has not had filled yet. She reports severe pain, bloody, purulent drainage and increased redness. She has not done anything to treat her symptoms. Touching the area makes the pain and drainage worse. Denies alleviating factors. Denies fever, chills, red streaking, nausea or vomiting. Denies h/o abscesses. PMHx of trichomonas and chlamydia.  Past Medical History  Diagnosis Date  . Trichomonas 04/25/2011  . Chlamydia    Past Surgical History  Procedure Laterality Date  . Vaginal delivery     No family history on file. History  Substance Use Topics  . Smoking status: Current Every Day Smoker -- 0.10 packs/day for 3 years  . Smokeless tobacco: Never Used  . Alcohol Use: Yes     Comment: social, not while pregnant   OB History    Gravida Para Term Preterm AB TAB SAB Ectopic Multiple Living   0 0 0 0 0 0 5      Review of Systems  Constitutional: Negative for fever and chills.  Gastrointestinal: Negative for nausea and vomiting.  Skin: Positive for color change (right posterior thigh).  All other systems reviewed and are negative.   Allergies  Review of patient's allergies indicates no known allergies.  Home Medications   Prior to Admission medications   Medication Sig Start Date End Date  Taking? Authorizing Provider  clindamycin (CLEOCIN) 150 MG capsule Take 3 capsules (450 mg total) by mouth 3 (three) times daily. May dispense as  capsules 11/01/14   Everlene Farrier, PA-C  HYDROcodone-acetaminophen (NORCO/VICODIN) 5-325 MG per tablet Take 1-2 tablets by mouth every 6 (six) hours as needed for severe pain (For pain not controlled by ibuprofen). 11/02/14   Antony Madura, PA-C   Triage Vitals: BP 112/65 mmHg  Pulse 100  Temp(Src) 98.8 F (37.1 C) (Oral)  Resp 18  SpO2 100%  LMP 10/26/2014  Physical Exam  Constitutional: She is oriented to person, place, and time. She appears well-developed and well-nourished. No distress.  Nontoxic/nonseptic appearing  HENT:  Head: Normocephalic and atraumatic.  Eyes: Conjunctivae and EOM are normal. No scleral icterus.  Neck: Normal range of motion.  Pulmonary/Chest: Effort normal. No respiratory distress.  Respirations even and unlabored  Musculoskeletal: Normal range of motion.       Right upper leg: She exhibits tenderness and swelling. She exhibits no bony tenderness, no edema, no deformity and no laceration.       Legs: 5x6cm area of erythema with induration and drainage to the posterior lateral R thigh. No streaking.  Neurological: She is alert and oriented to person, place, and time. She exhibits normal muscle tone. Coordination normal.  GCS 15. Speech is goal oriented. Patient moves extremities without ataxia. She ambulates with steady gait.  Skin: Skin is warm and dry. No rash  noted. She is not diaphoretic. There is erythema. No pallor.  Psychiatric: She has a normal mood and affect. Her behavior is normal.  Nursing note and vitals reviewed.   ED Course  Procedures (including critical care time) DIAGNOSTIC STUDIES: Oxygen Saturation is 100% on RA, normal by my interpretation.   COORDINATION OF CARE: 8:28 PM- Will I & D abscess and give first dose of antibiotic prior to discharge. Will order pain medication prior to  discharge and soak in warm water 3 times daily. Encouraged pt to have prescription filled. Return precautions discussed. Pt verbalizes understanding and agrees to plan.  INCISION AND DRAINAGE PROCEDURE NOTE: Patient identification was confirmed and verbal consent was obtained. This procedure was performed by Antony MaduraKelly Zachery Niswander, PA-C at 9:04 PM. Site: right posterior thigh Sterile procedures observed Needle size: 25 G Anesthetic used (type and amt): Lidocaine 2% with Epinephrine (6 mLs) Blade size: 11 Drainage: minimal purulence Complexity: Complex Packing used: none Site anesthetized, incision made over site, wound drained and explored loculations, rinsed with copious amounts of normal saline, wound packed with sterile gauze, covered with dry, sterile dressing.  Pt tolerated procedure well without complications.  Instructions for care discussed verbally and pt provided with additional written instructions for homecare and f/u.  Medications  lidocaine-EPINEPHrine (XYLOCAINE W/EPI) 2 %-1:100000 (with pres) injection 20 mL (not administered)  clindamycin (CLEOCIN) capsule 300 mg (300 mg Oral Given 11/02/14 2203)  HYDROcodone-acetaminophen (NORCO/VICODIN) 5-325 MG per tablet 2 tablet (2 tablets Oral Given 11/02/14 2203)    Labs Review Labs Reviewed - No data to display  Imaging Review No results found.   EKG Interpretation None      MDM   Final diagnoses:  Cellulitis and abscess of leg    Patient with skin abscess amenable to incision and drainage.  Abscess was not large enough to warrant packing or drain, wound recheck in 2 days advised if symptoms worsen. Encouraged home warm soaks and flushing. Mild signs of cellulitis is surrounding skin. Patient placed on Clindamycin yesterday; advised continuation of the full course. Will d/c to home. Return precautions given. Patient agreeable to plan with no unaddressed concerns.  I personally performed the services described in this  documentation, which was scribed in my presence. The recorded information has been reviewed and is accurate.   Filed Vitals:   11/02/14 2013 11/02/14 2014  BP:  112/65  Pulse: 100   Temp: 98.8 F (37.1 C)   TempSrc: Oral   Resp: 18   SpO2: 100%      Antony MaduraKelly Halsey Persaud, PA-C 11/02/14 2208  Doug SouSam Jacubowitz, MD 11/03/14 0003

## 2015-05-02 ENCOUNTER — Encounter (HOSPITAL_COMMUNITY): Payer: Self-pay | Admitting: *Deleted

## 2015-05-02 ENCOUNTER — Inpatient Hospital Stay (HOSPITAL_COMMUNITY)
Admission: AD | Admit: 2015-05-02 | Discharge: 2015-05-02 | Disposition: A | Discharge status: Home / Self Care | Payer: Self-pay | Source: Ambulatory Visit | Attending: Obstetrics & Gynecology | Admitting: Obstetrics & Gynecology

## 2015-05-02 DIAGNOSIS — R112 Nausea with vomiting, unspecified: Secondary | ICD-10-CM | POA: Insufficient documentation

## 2015-05-02 DIAGNOSIS — A599 Trichomoniasis, unspecified: Secondary | ICD-10-CM

## 2015-05-02 DIAGNOSIS — O219 Vomiting of pregnancy, unspecified: Secondary | ICD-10-CM

## 2015-05-02 DIAGNOSIS — A5901 Trichomonal vulvovaginitis: Secondary | ICD-10-CM | POA: Insufficient documentation

## 2015-05-02 DIAGNOSIS — Z3A22 22 weeks gestation of pregnancy: Secondary | ICD-10-CM | POA: Insufficient documentation

## 2015-05-02 DIAGNOSIS — O98312 Other infections with a predominantly sexual mode of transmission complicating pregnancy, second trimester: Secondary | ICD-10-CM | POA: Insufficient documentation

## 2015-05-02 DIAGNOSIS — O26892 Other specified pregnancy related conditions, second trimester: Secondary | ICD-10-CM | POA: Insufficient documentation

## 2015-05-02 DIAGNOSIS — F17200 Nicotine dependence, unspecified, uncomplicated: Secondary | ICD-10-CM | POA: Insufficient documentation

## 2015-05-02 LAB — WET PREP, GENITAL: YEAST WET PREP: NONE SEEN

## 2015-05-02 LAB — URINALYSIS, ROUTINE W REFLEX MICROSCOPIC
Bilirubin Urine: NEGATIVE
Glucose, UA: NEGATIVE mg/dL
Hgb urine dipstick: NEGATIVE
Ketones, ur: >80 mg/dL — AB
NITRITE: NEGATIVE
PH: 6.5 (ref 5.0–8.0)
Protein, ur: NEGATIVE mg/dL
SPECIFIC GRAVITY, URINE: 1.025 (ref 1.005–1.030)
UROBILINOGEN UA: 2 mg/dL — AB (ref 0.0–1.0)

## 2015-05-02 LAB — URINE MICROSCOPIC-ADD ON

## 2015-05-02 LAB — POCT PREGNANCY, URINE: PREG TEST UR: POSITIVE — AB

## 2015-05-02 MED ORDER — METRONIDAZOLE 500 MG PO TABS
2000.0000 mg | ORAL_TABLET | Freq: Once | ORAL | Status: AC
  Administered 2015-05-02: 2000 mg via ORAL
  Filled 2015-05-02: qty 4

## 2015-05-02 MED ORDER — METOCLOPRAMIDE HCL 10 MG PO TABS: 10.0000 mg | ORAL_TABLET | Freq: Four times a day (QID) | ORAL | Status: DC

## 2015-05-02 MED ORDER — DEXTROSE 5 % IN LACTATED RINGERS IV BOLUS: 1000.0000 mL | Freq: Once | INTRAVENOUS | Status: DC

## 2015-05-02 MED ORDER — METOCLOPRAMIDE HCL 10 MG PO TABS
10.0000 mg | ORAL_TABLET | Freq: Once | ORAL | Status: AC
  Administered 2015-05-02: 10 mg via ORAL
  Filled 2015-05-02: qty 1

## 2015-05-02 NOTE — Discharge Instructions (Signed)
Expedited Partner Therapy:  °Information Sheet for Patients and Partners  °            ° °You have been offered expedited partner therapy (EPT). This information sheet contains important information and warnings you need to be aware of, so please read it carefully.  ° °Expedited Partner Therapy (EPT) is the clinical practice of treating the sexual partners of persons who receive chlamydia, gonorrhea, or trichomoniasis diagnoses by providing medications or prescriptions to the patient. Patients then provide partners with these therapies without the health-care provider having examined the partner. In other words, EPT is a convenient, fast and private way for patients to help their sexual partners get treated.  ° °Chlamydia and gonorrhea are bacterial infections you get from having sex with a person who is already infected. Trichomoniasis (or “trich”) is a very common sexually transmitted infection (STI) that is caused by infection with a protozoan parasite called Trichomonas vaginalis.  Many people with these infections don’t know it because they feel fine, but without treatment these infections can cause serious health problems, such as pelvic inflammatory disease, ectopic pregnancy, infertility and increased risk of HIV.  ° °It is important to get treated as soon as possible to protect your health, to avoid spreading these infections to others, and to prevent yourself from becoming re-infected. The good news is these infections can be easily cured with proper antibiotic medicine. The best way to take care of your self is to see a doctor or go to your local health department. If you are not able to see a doctor or other medical provider, you should take EPT.  ° ° °Recommended Medication: °EPT for Chlamydia:  Azithromycin (Zithromax) 1 gram orally in a single dose °EPT for Gonorrhea:  Cefixime (Suprax) 400 milligrams orally in a single dose PLUS azithromycin (Zithromax) 1 gram orally in a single dose °EPT for  Trichomoniasis:  Metronidazole (Flagyl) 2 grams orally in a single dose ° ° °These medicines are very safe. However, you should not take them if you have ever had an allergic reaction (like a rash) to any of these medicines: azithromycin (Zithromax), erythromycin, clarithromycin (Biaxin), metronidazole (Flagyl), tinidazole (Tindimax). If you are uncertain about whether you have an allergy, call your medical provider or pharmacist before taking this medicine. If you have a serious, long-term illness like kidney, liver or heart disease, colitis or stomach problems, or you are currently taking other prescription medication, talk to your provider before taking this medication.  ° °Women: If you have lower belly pain, pain during sex, vomiting, or a fever, do not take this medicine. Instead, you should see a medical provider to be certain you do not have pelvic inflammatory disease (PID). PID can be serious and lead to infertility, pregnancy problems or chronic pelvic pain.  ° °Pregnant Women: It is very important for you to see a doctor to get pregnancy services and pre-natal care. These antibiotics for EPT are safe for pregnant women, but you still need to see a medical provider as soon as possible. It is also important to note that Doxycycline is an alternative therapy for chlamydia, but it should not be taken by someone who is pregnant.  ° °Men: If you have pain or swelling in the testicles or a fever, do not take this medicine and see a medical provider.    ° °Men who have sex with men (MSM): MSM in [place] continue to experience high rates of syphilis and HIV. Many MSM with gonorrhea or   chlamydia could also have syphilis and/or HIV and not know it. If you are a man who has sex with other men, it is very important that you see a medical provider and are tested for HIV and syphilis. EPT is not recommended for gonorrhea for MSM.  Recommended treatment for gonorrhea for MSM is Rocephin (shot) AND azithromycin  due to decreased cure rate.  Please see your medical provider if this is the case.   ° °Along with this information sheet is a prescription for the medicine. If you receive a prescription it will be in your name and will indicate your date of birth, or it will be in the name of “Expedited Partner Therapy”.   In either case, you can have the prescription filled at a pharmacy. You will be responsible for the cost of the medicine, unless you have prescription drug coverage. In that case, you could provide your name so the pharmacy could bill your health plan.  ° °Take the medication as directed. Some people will have a mild, upset stomach, which does not last long. AVOID alcohol 24 hours after taking metronidazole (Flagyl) to reduce the possibility of a disulfiram-like reaction (severe vomiting and abdominal pain).  After taking the medicine, do not have sex for 7 days. Do not share this medicine or give it to anyone else. It is important to tell everyone you have had sex with in the last 60 days that they need to go and get tested for sexually transmitted infections.  ° °Ways to prevent these and other sexually transmitted infections (STIs):  ° °• Abstain from sex. This is the only sure way to avoid getting an STI.  °• Use barrier methods, such as condoms, consistently and correctly.  °• Limit the number of sexual partners.  °• Have regular physical exams, including testing for STIs.  ° °For more information about EPT or other issues pertaining to an STI, please contact your medical provider or the [place] [facility] at [phone] or [email or web address]   ° °

## 2015-05-02 NOTE — MAU Provider Note (Signed)
History     CSN: [id number]  Arrival date and time: 05/02/15 0936   None     No chief complaint on file.  HPI 24 y.o. [id number] at [redacted]w[redacted]d w/ upper and lower abd pain and n/v. States she is not able to keep anything down. Upper abd pain is ongoing, has known gallstones, no changes. Low abd pain is intermittent, no bleeding, but + Ehmke discharge w/ some itching.   Past Medical History  Diagnosis Date  . Trichomonas 04/25/2011  . Chlamydia     Past Surgical History  Procedure Laterality Date  . Vaginal delivery      No family history on file.  Social History  Substance Use Topics  . Smoking status: Current Every Day Smoker -- 0.10 packs/day for 3 years  . Smokeless tobacco: Never Used  . Alcohol Use: Yes     Comment: social, not while pregnant    Allergies: No Known Allergies  Prescriptions prior to admission  Medication Sig Dispense Refill Last Dose  . clindamycin (CLEOCIN) 150 MG capsule Take 3 capsules (450 mg total) by mouth 3 (three) times daily. May dispense as  capsules 90 capsule 0   . HYDROcodone-acetaminophen (NORCO/VICODIN) 5-325 MG per tablet Take 1-2 tablets by mouth every 6 (six) hours as needed for severe pain (For pain not controlled by ibuprofen). 11 tablet 0     Review of Systems  Constitutional: Negative.   Respiratory: Negative.   Cardiovascular: Negative.   Gastrointestinal: Positive for nausea, vomiting and abdominal pain. Negative for diarrhea and constipation.  Genitourinary: Negative for dysuria, urgency, frequency, hematuria and flank pain.       Negative for vaginal bleeding, + cramping, + discharge   Musculoskeletal: Negative.   Neurological: Negative.   Psychiatric/Behavioral: Negative.    Physical Exam   unknown if currently breastfeeding.  Physical Exam  Nursing note and vitals reviewed. Constitutional: She is oriented to person, place, and time. She appears well-developed and well-nourished. No distress.  HENT:  Head:  Normocephalic and atraumatic.  Cardiovascular: Normal rate, regular rhythm and normal heart sounds.   Respiratory: Effort normal and breath sounds normal. No respiratory distress.  GI: Soft. Bowel sounds are normal. She exhibits no distension and no mass. There is no tenderness. There is no rebound and no guarding.  Genitourinary: There is no rash or lesion on the right labia. There is no rash or lesion on the left labia. Uterus is not deviated, not enlarged, not fixed and not tender. Cervix exhibits no motion tenderness, no discharge and no friability. Right adnexum displays no mass, no tenderness and no fullness. Left adnexum displays no mass, no tenderness and no fullness. No erythema, tenderness or bleeding in the vagina. Vaginal discharge (thin Fournier) found.  Cervix closed + FHR 150s   Neurological: She is alert and oriented to person, place, and time.  Skin: Skin is warm and dry.  Psychiatric: She has a normal mood and affect.    MAU Course  Procedures  Results for orders placed or performed during the hospital encounter of 05/02/15 (from the past 24 hour(s))  Urinalysis, Routine w reflex microscopic (not at [facility])     Status: Abnormal   Collection Time: 05/02/15 10:00 AM  Result Value Ref Range   Color, Urine YELLOW YELLOW   APPearance CLEAR CLEAR   Specific Gravity, Urine 1.025 1.005 - 1.030   pH 6.5 5.0 - 8.0   Glucose, UA NEGATIVE NEGATIVE mg/dL   Hgb urine dipstick NEGATIVE NEGATIVE  Bilirubin Urine NEGATIVE NEGATIVE   Ketones, ur >80 (A) NEGATIVE mg/dL   Protein, ur NEGATIVE NEGATIVE mg/dL   Urobilinogen, UA 2.0 (H) 0.0 - 1.0 mg/dL   Nitrite NEGATIVE NEGATIVE   Leukocytes, UA MODERATE (A) NEGATIVE  Urine microscopic-add on     Status: Abnormal   Collection Time: 05/02/15 10:00 AM  Result Value Ref Range   Squamous Epithelial / LPF MANY (A) RARE   WBC, UA 11-20 <3 WBC/hpf   Bacteria, UA RARE RARE   Urine-Other MUCOUS PRESENT   Pregnancy, urine POC     Status:  Abnormal   Collection Time: 05/02/15 10:19 AM  Result Value Ref Range   Preg Test, Ur POSITIVE (A) NEGATIVE  Wet prep, genital     Status: Abnormal   Collection Time: 05/02/15 10:45 AM  Result Value Ref Range   Yeast Wet Prep HPF POC NONE SEEN NONE SEEN   Trich, Wet Prep FEW (A) NONE SEEN   Clue Cells Wet Prep HPF POC FEW (A) NONE SEEN   WBC, Wet Prep HPF POC FEW (A) NONE SEEN   Reglan 10 mg PO for nausea, offered IV hydration considering ketones on UA, but patient stated she was feeling better, tolerating PO fluids after reglan and declined IV hydration, states she's ready to leave and get something to eat Treated for Trich in MAU w/ Flagyl 2000 mg PO x 1  Assessment and Plan   1. Nausea and vomiting in pregnancy prior to [redacted] weeks gestation   2. Trichimoniasis   Reglan for N/V, f/u for prenatal care ASAP Treated for trich in MAU today, partner treatment rx provided w/ partner treatment info sheet, no unprotected IC x 7 days after treatment, rev'd ETOH precautions    Medication List    STOP taking these medications        clindamycin 150 MG capsule  Commonly known as:  CLEOCIN     HYDROcodone-acetaminophen 5-325 MG tablet  Commonly known as:  NORCO/VICODIN      TAKE these medications        metoCLOPramide 10 MG tablet  Commonly known as:  REGLAN  Take 1 tablet (10 mg total) by mouth 4 (four) times daily.            Follow-up Information    Follow up with [facility] provider of your choice.   Why:  as soon as possible for prenatal care        FRAZIER,NATALIE 05/02/2015, 9:39 AM

## 2015-05-02 NOTE — MAU Note (Signed)
Pt has had period a period in 3 months, has been vomiting, having lower abd pain & starts her gallbladder has been hurting - known gallstones.  Has not done pregnancy test.

## 2015-05-03 LAB — GC/CHLAMYDIA PROBE AMP (~~LOC~~) NOT AT ARMC
CHLAMYDIA, DNA PROBE: NEGATIVE
Neisseria Gonorrhea: NEGATIVE

## 2015-05-03 LAB — HIV ANTIBODY (ROUTINE TESTING W REFLEX): HIV SCREEN 4TH GENERATION: NONREACTIVE

## 2015-07-25 NOTE — L&D Delivery Note (Signed)
Delivery Note At  a viable female was delivered at home   weight  .   Placenta status:spont , 3vc Cord:  with the following complications: .  Cord pH: n/a  Anesthesia: none  Episiotomy: none  Lacerations: none Suture Repair: n/a Est. Blood Loss (100 mL):    Mom to postpartum.  Baby to Couplet care / Skin to Skin.  Mirjana Tarleton A 10/03/2015, 6:44 PM

## 2015-09-28 ENCOUNTER — Encounter (HOSPITAL_COMMUNITY): Payer: Self-pay

## 2015-09-28 ENCOUNTER — Inpatient Hospital Stay (HOSPITAL_COMMUNITY)
Admission: AD | Admit: 2015-09-28 | Discharge: 2015-09-28 | Disposition: A | Discharge status: Home / Self Care | Payer: Medicaid Other | Source: Ambulatory Visit | Attending: Obstetrics | Admitting: Obstetrics

## 2015-09-28 DIAGNOSIS — O99613 Diseases of the digestive system complicating pregnancy, third trimester: Secondary | ICD-10-CM | POA: Diagnosis not present

## 2015-09-28 DIAGNOSIS — E860 Dehydration: Secondary | ICD-10-CM | POA: Diagnosis not present

## 2015-09-28 DIAGNOSIS — K529 Noninfective gastroenteritis and colitis, unspecified: Secondary | ICD-10-CM | POA: Insufficient documentation

## 2015-09-28 DIAGNOSIS — Z87891 Personal history of nicotine dependence: Secondary | ICD-10-CM | POA: Diagnosis not present

## 2015-09-28 DIAGNOSIS — Z3A34 34 weeks gestation of pregnancy: Secondary | ICD-10-CM | POA: Diagnosis not present

## 2015-09-28 DIAGNOSIS — O9989 Other specified diseases and conditions complicating pregnancy, childbirth and the puerperium: Secondary | ICD-10-CM

## 2015-09-28 DIAGNOSIS — O212 Late vomiting of pregnancy: Secondary | ICD-10-CM | POA: Diagnosis present

## 2015-09-28 DIAGNOSIS — O368130 Decreased fetal movements, third trimester, not applicable or unspecified: Secondary | ICD-10-CM | POA: Diagnosis not present

## 2015-09-28 LAB — CBC
HCT: 36.2 % (ref 36.0–46.0)
HEMOGLOBIN: 12.4 g/dL (ref 12.0–15.0)
MCH: 30.5 pg (ref 26.0–34.0)
MCHC: 34.3 g/dL (ref 30.0–36.0)
MCV: 88.9 fL (ref 78.0–100.0)
Platelets: 297 10*3/uL (ref 150–400)
RBC: 4.07 MIL/uL (ref 3.87–5.11)
RDW: 12.6 % (ref 11.5–15.5)
WBC: 12.6 10*3/uL — ABNORMAL HIGH (ref 4.0–10.5)

## 2015-09-28 LAB — URINE MICROSCOPIC-ADD ON: RBC / HPF: NONE SEEN RBC/hpf (ref 0–5)

## 2015-09-28 LAB — URINALYSIS, ROUTINE W REFLEX MICROSCOPIC
BILIRUBIN URINE: NEGATIVE
Glucose, UA: NEGATIVE mg/dL
HGB URINE DIPSTICK: NEGATIVE
Ketones, ur: >80 mg/dL — AB
NITRITE: NEGATIVE
PROTEIN: NEGATIVE mg/dL
Specific Gravity, Urine: >1.03 — ABNORMAL HIGH (ref 1.005–1.030)
pH: 6 (ref 5.0–8.0)

## 2015-09-28 MED ORDER — DICYCLOMINE HCL 10 MG PO CAPS
20.0000 mg | ORAL_CAPSULE | Freq: Once | ORAL | Status: AC
  Administered 2015-09-28: 20 mg via ORAL
  Filled 2015-09-28: qty 2

## 2015-09-28 MED ORDER — SODIUM CHLORIDE 0.9 % IV SOLN
25.0000 mg | Freq: Once | INTRAVENOUS | Status: AC
  Administered 2015-09-28: 25 mg via INTRAVENOUS
  Filled 2015-09-28: qty 1

## 2015-09-28 MED ORDER — PROMETHAZINE HCL 25 MG PO TABS: 25.0000 mg | ORAL_TABLET | Freq: Four times a day (QID) | ORAL | Status: DC | PRN

## 2015-09-28 NOTE — MAU Provider Note (Signed)
Chief Complaint:  Emesis   First Provider Initiated Contact with Patient 09/28/15 1640     HPI  Shelby Nichols is a 25 y.o. [id number] at 1134w5dwho presents to maternity admissions reporting nausea, vomiting and diarrhea since this morning.  Also has abdominal cramping.  Also c/o decreased fetal movement.   Her child has been sick with the same thing. She reports good fetal movement, denies LOF, vaginal bleeding, vaginal itching/burning, urinary symptoms, h/a, dizziness, n/v, diarrhea, constipation or fever/chills.  She denies headache, visual changes or abdominal pain.  RN Note: Pt c/o vomiting and diarrhea since she woke up this morning. Pt's daughter has been sick with a virus. Pt reports abdominal pain starting at the same time. Pt is not sure if it her stomach or contractions. Pt denies bleeding and leaking of fluid. Pt states baby is not moving as much as normal.           Past Medical History: Past Medical History  Diagnosis Date  . Trichomonas 04/25/2011  . Chlamydia     Past obstetric history: OB History  Gravida Para Term Preterm AB SAB TAB Ectopic Multiple Living  6 5 5  0 0 0 0 0 0 5    # Outcome Date GA Lbr Len/2nd Weight Sex Delivery Anes PTL Lv  6 Current           5 Term 08/04/12 4959w3d 14:34 / 00:10 5 lb 14.2 oz (2.671 kg) F Vag-Spont EPI  Y  4 Term 10/05/09    Genella MechM Vag-Spont EPI  Y  3 Term 08/03/08    F Vag-Spont EPI  Y  2 Term 06/19/07    Genella MechM Vag-Spont EPI  Y  1 Term 11/22/05    F Vag-Spont EPI  Y      Past Surgical History: Past Surgical History  Procedure Laterality Date  . Vaginal delivery      Family History: Family History  Problem Relation Age of Onset  . Cancer Neg Hx   . Diabetes Neg Hx   . Hypertension Neg Hx     Social History: Social History  Substance Use Topics  . Smoking status: Former Smoker -- 0.10 packs/day for 3 years    Types: Cigarettes  . Smokeless tobacco: Never Used  . Alcohol Use: Yes     Comment: social, not while pregnant     Allergies: No Known Allergies  Meds:  Prescriptions prior to admission  Medication Sig Dispense Refill Last Dose  . metoCLOPramide (REGLAN) 10 MG tablet Take 1 tablet (10 mg total) by mouth 4 (four) times daily. 60 tablet 1     I have reviewed patient's Past Medical Hx, Surgical Hx, Family Hx, Social Hx, medications and allergies.   ROS:  Review of Systems  Constitutional: Positive for fatigue. Negative for fever and chills.  Gastrointestinal: Positive for nausea, vomiting, abdominal pain (cramping) and diarrhea. Negative for constipation.  Genitourinary: Negative for dysuria, vaginal bleeding and vaginal discharge.  Musculoskeletal: Negative for back pain.   Other systems negative  Physical Exam  Patient Vitals for the past 24 hrs:  BP Temp Temp src Pulse Resp SpO2 Height Weight  09/28/15 1643 - - - 109 - 99 % - -  09/28/15 1638 - - - 96 - 99 % - -  09/28/15 1633 - - - 105 - 100 % - -  09/28/15 1628 - - - 95 - 99 % - -  09/28/15 1623 - - - 98 - 100 % - -  09/28/15 1618 - - - (!) 136 - 97 % - -  09/28/15 1617 114/69 mmHg 98.3 F (36.8 C) Oral (!) 138 20 100 % 5' (1.524 m) 180 lb (81.647 kg)   Constitutional: Well-developed,  female in no acute distress.  Cardiovascular: normal rate and rhythm but slightly tachycardic Respiratory: normal effort, clear to auscultation bilaterally GI: Abd soft, diffusely mildly tender, gravid appropriate for gestational age.   No rebound or guarding. MS: Extremities nontender, no edema, normal ROM Neurologic: Alert and oriented x 4.  GU: Neg CVAT.  PELVIC EXAM: Cervix firm, posterior, neg CMT, uterus nontender, Fundal Height consistent with dates, adnexa without tenderness, enlargement, or mass  Dilation: Closed Effacement (%): 80 Cervical Position: Posterior Station:  (high) Exam by:: m Exzavier Ruderman,cnm  FHT:  Baseline 140 , moderate variability, accelerations present, no decelerations Contractions: Irregular    Labs: Results for  orders placed or performed during the hospital encounter of 09/28/15 (from the past 24 hour(s))  Urinalysis, Routine w reflex microscopic (not at [facility])     Status: Abnormal   Collection Time: 09/28/15  4:10 PM  Result Value Ref Range   Color, Urine YELLOW YELLOW   APPearance HAZY (A) CLEAR   Specific Gravity, Urine >1.030 (H) 1.005 - 1.030   pH 6.0 5.0 - 8.0   Glucose, UA NEGATIVE NEGATIVE mg/dL   Hgb urine dipstick NEGATIVE NEGATIVE   Bilirubin Urine NEGATIVE NEGATIVE   Ketones, ur >80 (A) NEGATIVE mg/dL   Protein, ur NEGATIVE NEGATIVE mg/dL   Nitrite NEGATIVE NEGATIVE   Leukocytes, UA SMALL (A) NEGATIVE  Urine microscopic-add on     Status: Abnormal   Collection Time: 09/28/15  4:10 PM  Result Value Ref Range   Squamous Epithelial / LPF 6-30 (A) NONE SEEN   WBC, UA 6-30 0 - 5 WBC/hpf   RBC / HPF NONE SEEN 0 - 5 RBC/hpf   Bacteria, UA RARE (A) NONE SEEN   Urine-Other MUCOUS PRESENT       Imaging:  No results found.  MAU Course/MDM: I have ordered labs and reviewed results.  NST reviewed  Treatments in MAU included IV hydration and administration of Phenergan for nausea and Bentyl for cramps. She felt better after these and requested to go home to get her child.   Contractions decreased with hydration.  I advised I would recommend another liter of fluid, but she wants to go home.  .    Assessment: SIUP at [redacted]w[redacted]d  Gastroenteritis, community acquired Mild to moderate dehydration   Plan: Discharge home Rx Phenergan for PRN home use for nausea. Preterm Labor precautions and fetal kick counts Follow up in Office for prenatal visits and recheck of status Advised her to call office in AM.     Medication List    ASK your doctor about these medications        metoCLOPramide 10 MG tablet  Commonly known as:  REGLAN  Take 1 tablet (10 mg total) by mouth 4 (four) times daily.       Pt stable at time of discharge. Encouraged to return here or to other Urgent Care/ED if  she develops worsening of symptoms, increase in pain, fever, or other concerning symptoms.      Wynelle Bourgeois CNM, MSN Certified Nurse-Midwife 09/28/2015 4:49 PM

## 2015-09-28 NOTE — Discharge Instructions (Signed)

## 2015-09-28 NOTE — MAU Note (Signed)
Pt c/o vomiting and diarrhea since she woke up this morning. Pt's daughter has been sick with a virus. Pt reports abdominal pain starting at the same time. Pt is not sure if it her stomach or contractions. Pt denies bleeding and leaking of fluid. Pt states baby is not moving as much as normal.

## 2015-10-03 ENCOUNTER — Inpatient Hospital Stay (HOSPITAL_COMMUNITY)
Admission: AD | Admit: 2015-10-03 | Discharge: 2015-10-04 | DRG: 775 | Disposition: A | Discharge status: Home / Self Care | Payer: Medicaid Other | Source: Ambulatory Visit | Attending: Obstetrics | Admitting: Obstetrics

## 2015-10-03 ENCOUNTER — Encounter (HOSPITAL_COMMUNITY): Payer: Self-pay | Admitting: *Deleted

## 2015-10-03 DIAGNOSIS — Z3A35 35 weeks gestation of pregnancy: Secondary | ICD-10-CM

## 2015-10-03 MED ORDER — ONDANSETRON HCL 4 MG/2ML IJ SOLN
4.0000 mg | INTRAMUSCULAR | Status: DC | PRN
  Filled 2015-10-03: qty 2

## 2015-10-03 MED ORDER — OXYTOCIN 10 UNIT/ML IJ SOLN
INTRAVENOUS | Status: AC
  Administered 2015-10-03: 19:00:00
  Filled 2015-10-03: qty 4

## 2015-10-03 MED ORDER — WITCH HAZEL-GLYCERIN EX PADS: 1.0000 "application " | MEDICATED_PAD | CUTANEOUS | Status: DC | PRN

## 2015-10-03 MED ORDER — DIPHENHYDRAMINE HCL 25 MG PO CAPS: 25.0000 mg | ORAL_CAPSULE | Freq: Four times a day (QID) | ORAL | Status: DC | PRN

## 2015-10-03 MED ORDER — BENZOCAINE-MENTHOL 20-0.5 % EX AERO: 1.0000 "application " | INHALATION_SPRAY | CUTANEOUS | Status: DC | PRN

## 2015-10-03 MED ORDER — ONDANSETRON HCL 4 MG PO TABS: 4.0000 mg | ORAL_TABLET | ORAL | Status: DC | PRN

## 2015-10-03 MED ORDER — SENNOSIDES-DOCUSATE SODIUM 8.6-50 MG PO TABS
2.0000 | ORAL_TABLET | ORAL | Status: DC
  Administered 2015-10-04: 2 via ORAL
  Filled 2015-10-03: qty 2

## 2015-10-03 MED ORDER — ZOLPIDEM TARTRATE 5 MG PO TABS: 5.0000 mg | ORAL_TABLET | Freq: Every evening | ORAL | Status: DC | PRN

## 2015-10-03 MED ORDER — PRENATAL MULTIVITAMIN CH
1.0000 | ORAL_TABLET | Freq: Every day | ORAL | Status: DC
  Administered 2015-10-04: 1 via ORAL
  Filled 2015-10-03: qty 1

## 2015-10-03 MED ORDER — ACETAMINOPHEN 325 MG PO TABS: 650.0000 mg | ORAL_TABLET | ORAL | Status: DC | PRN

## 2015-10-03 MED ORDER — SIMETHICONE 80 MG PO CHEW: 80.0000 mg | CHEWABLE_TABLET | ORAL | Status: DC | PRN

## 2015-10-03 MED ORDER — IBUPROFEN 600 MG PO TABS
600.0000 mg | ORAL_TABLET | Freq: Four times a day (QID) | ORAL | Status: DC
  Administered 2015-10-03 – 2015-10-04 (×4): 600 mg via ORAL
  Filled 2015-10-03 (×4): qty 1

## 2015-10-03 MED ORDER — LANOLIN HYDROUS EX OINT: TOPICAL_OINTMENT | CUTANEOUS | Status: DC | PRN

## 2015-10-03 MED ORDER — TETANUS-DIPHTH-ACELL PERTUSSIS 5-2.5-18.5 LF-MCG/0.5 IM SUSP
0.5000 mL | Freq: Once | INTRAMUSCULAR | Status: DC
Start: 2015-10-04 — End: 2015-10-04

## 2015-10-03 MED ORDER — DIBUCAINE 1 % RE OINT: 1.0000 "application " | TOPICAL_OINTMENT | RECTAL | Status: DC | PRN

## 2015-10-03 MED ORDER — FERROUS SULFATE 325 (65 FE) MG PO TABS
325.0000 mg | ORAL_TABLET | Freq: Two times a day (BID) | ORAL | Status: DC
  Administered 2015-10-04: 325 mg via ORAL
  Filled 2015-10-03: qty 1

## 2015-10-03 NOTE — H&P (Signed)
This is Dr. Francoise CeoBernard Roselina Burgueno dictating the history and physical on  Shelby Nichols  she's a 25 year old gravida 6 para 5005 EDC 11/04/2015 at 35 weeks and 3 days the patient GBS is unknown and the patient states that she had a few contractions at home and then had a normal vaginal delivery at home she was brought here by EMS and a placenta was spontaneous intact she had no lacerations noted Past medical history negative Past surgical history negative Social history negative System review negative Physical exam well-developed female post delivery at home HEENT negative Lungs clear to P&A Heart regular rhythm no murmurs no gallops Breasts negative Abdomen uterus 20 week size postpartum Extremities negative Pelvic as described above

## 2015-10-04 LAB — CBC
HEMATOCRIT: 31.2 % — AB (ref 36.0–46.0)
HEMOGLOBIN: 10.5 g/dL — AB (ref 12.0–15.0)
MCH: 29.9 pg (ref 26.0–34.0)
MCHC: 33.7 g/dL (ref 30.0–36.0)
MCV: 88.9 fL (ref 78.0–100.0)
Platelets: 260 10*3/uL (ref 150–400)
RBC: 3.51 MIL/uL — AB (ref 3.87–5.11)
RDW: 12.5 % (ref 11.5–15.5)
WBC: 19.1 10*3/uL — ABNORMAL HIGH (ref 4.0–10.5)

## 2015-10-04 LAB — RPR: RPR Ser Ql: NONREACTIVE

## 2015-10-04 LAB — HEPATITIS B SURFACE ANTIGEN: Hepatitis B Surface Ag: NEGATIVE

## 2015-10-04 NOTE — Discharge Instructions (Signed)
Discharge instructions   You can wash your hair  Shower  Eat what you want  Drink what you want  See me in 6 weeks  Your ankles are going to swell more in the next 2 weeks than when pregnant  No sex for 6 weeks   Burr Soffer A, MD 10/04/2015

## 2015-10-04 NOTE — Progress Notes (Signed)
Patient ID: Shelby EavesMartika J Nichols, female   DOB: 08-16-1990, 25 y.o.   MRN: [id number] Postpartum day one Blood pressure 95/74 pulse 63 respiration 18 Fundus firm Lochia moderate Legs negative discharge today

## 2015-10-04 NOTE — Discharge Summary (Signed)
This patient is a 25 year old gravida [phone] at 35 weeks and 3 days who did not receive prenatal care in my office she had one visit in January and since then has not been back to the office she has had no lab work done or any prenatal visits and patient delivered at home came to the hospital stated that I was her doctor when I have not provided care for her during this pregnancy

## 2015-10-04 NOTE — Progress Notes (Signed)
CLINICAL SOCIAL WORK MATERNAL/CHILD NOTE  Patient Details  Name: Shelby Nichols MRN: [id number] Date of Birth: May 06, 2018  Date:  10-15-15  Clinical Social Worker Initiating Note:  Elissa Hefty, MSW intern  Date/ Time Initiated:  10/04/15/0900     Child's Name:  Shelby Nichols    Legal Guardian:  Steele Berg and Mamie Nick    Need for Interpreter:  None   Date of Referral:  09/17/15     Reason for Referral:  Late or No Prenatal Care , Behavioral Health Issues, including SI    Referral Source:  [facility]   Address:  [address] [place], [place] [address]  Phone number:  [phone]   Household Members:  Self, Minor Children, Significant Other   Natural Supports (not living in the home):  Children, Immediate Family, Spouse/significant other   Professional Supports: None   Employment: Self-employed   Type of Work: Probation officer    Education:  Database administrator Resources:  Medicaid   Other Resources:  [facility]   Cultural/Religious Considerations Which May Impact Care:  None Reported   Strengths:  Ability to meet basic needs , Home prepared for child    Risk Factors/Current Problems:   Mental Health Concerns- Shelby Nichols reported that she experienced the baby blues after her second son in 2011. Shelby Nichols shared she felt sad and cried a lot for about a month. Shelby Nichols voiced that two "baby workers" came to visit her and talk to her about her concerns. Shelby Nichols denied being in therapy or being prescribed any medications.  No Prenatal Care- Per Shelby Nichols, she found out she was pregnant in August but did not receive her Medicaid until January. According to [facility], she saw Dr. Ruthann Cancer one time in January and was scheduled to see him again March 15 @ 1pm but went into labor on Sunday 3/12 at home.    Cognitive State:  Insightful , Linear Thinking , Alert , Goal Oriented    Mood/Affect:  Comfortable , Relaxed , Bright , Happy , Interested    CSW Assessment:  MSW  intern presented in patients room due to a consult being placed in the cental nursery because of a history of PPD and no prenatal care. Shelby Nichols was alone in the room and provided verbal consent for MSW intern to engage. Shelby Nichols presented to be in a happy mood as evidence by her smiling and actively engaging during the assessment. Per Shelby Nichols, she went into labor at home in her kitchen and FOB helped her deliver the infant. Shelby Nichols shared that he also called 911 for them to bring both Shelby Nichols and the infant to the hospital since he had to stay home and attend to their other 5 children. Shelby Nichols reported she has 5 children, three girls and two boys at home, ranging form ages 63 to 71. Shelby Nichols expressed the birthing process was a bit scary at first because she was not due until next month and she did not know what was going on. Shelby Nichols stated she felt better once she saw that the infant was healthy and they both got to the hospital. Shelby Nichols expressed the birthing process to be unique and shared she was happy that all turned out well. Shelby Nichols voiced she was feeling well and denied being in any pain. According to Shelby Nichols, she is bottle feeding the infant but stated that the infant is not feeding well and may have to stay an extra night in order for them to watch the infant  and help her with her feeding. Shelby Nichols shared that she has met all of the infant's basic needs and FOB went home to take their other children to school and bring the car seat to the hospital. Per Shelby Nichols, she has a great support system at home and FOB is very helpful with the children. Shelby Nichols expressed her oldest being excited to help caring for the infant along with her other children as well. 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   MSW intern asked Shelby Nichols how her mental health was during the pregnancy. Shelby Nichols shared she had a "happy" pregnancy and ate "everything" she wanted. MSW intern asked Shelby Nichols how her mental health was after having her other children.  Shelby Nichols reported that she suffered the "baby blues" after her second son in 2011. Shelby Nichols described feelings of sadness and constantly crying for no reason. Shelby Nichols shared the symptoms didn't last more than a month and went away on their own. Shelby Nichols voiced she had two "baby workers" come to her home and process her feelings with her since she had voiced a concern to FOB. Shelby Nichols explained FOB was aware of her feelings and would constantly check on her to see how she was doing. Shelby Nichols denied being prescribed any medications or attending therapy. MSW intern provided education on perinatal mood disorders and provided Shelby Nichols with a handout containing additional resources and information. Shelby Nichols denied having any further questions or concerns but thanked MSW intern for the information provided.   MSW intern inquired about Shelby Nichols's limited prenatal care. Shelby Nichols shared she found out she was pregnant in August but was unable to acquire her Medicaid until January. According to [facility], she saw Dr. Ruthann Cancer once in January and was scheduled to see him again on March 15 at 1 pm but went into labor on Sunday. Shelby Nichols stated that she did not receive her actual Medicaid card until February and that is why she did not receive the prenatal care she wanted to have. MSW intern asked Shelby Nichols if she had traveled outside of the [place] during her pregnancy. Shelby Nichols denied going out of the country. MSW asked Shelby Nichols if she had been exposed to any substance use during the pregnancy. Shelby Nichols denied using any substances during her pregnancy. According to Shelby Nichols, FOB took good care of her and would not let her do anything that would potentially harm her or the infant. MSW intern informed Shelby Nichols about the hospital's drug screening policy and procedures. Shelby Nichols acknowledged the information but denied having any questions or concerns.   MSW intern asked Shelby Nichols what she liked to do for fun. Shelby Nichols shared she loves to do hair because it helps her de-stress and keeps her mind occupied. Shelby Nichols reported that she has a busy  schedule with the kids because they not only have academic responsibilities but also participate in various extracurricular activities. Shelby Nichols expressed she has to make sure they get their school work done and then take them to cheerleading, dance, and step practice along witrh meeting her house duties of cooking and cleaning. Shelby Nichols expressed she enjoys the time she is able to spend with her children and does not mind the busy schedule because it keeps her occupied. Shelby Nichols also voiced that FOB and her have date night every Saturday. MSW intern encouraged Shelby Nichols to continue with her routine if that is what she finds helpful when she starts to feel sad or overwhelmed. MSW intern also praised Shelby Nichols for having a set date  night with FOB in order to spend time alone away from the children. Shelby Nichols recognized the importance of self- care in order to remain sane and happy and shared she will continue to engage in self-care and do the things she finds joy in.   Shelby Nichols thanked MSW intern for stopping in and checking on her. She denied having any further questions or concerns but agreed to contact MSW intern if needs arise.     CSW Plan/Description:   Engineer, mining- MSW intern provided education on perinatal mood disorders and the hospital's support group, Feelings After Birth.  MSW intern to monitor UDS and cord tissue and file a CPS report as needed.  No Further Intervention Required/No Barriers to Discharge    Trevor Iha, Student-SW 2015-10-30, 9:36 AM

## 2016-02-17 ENCOUNTER — Encounter (HOSPITAL_COMMUNITY): Payer: Self-pay | Admitting: Emergency Medicine

## 2016-02-17 ENCOUNTER — Emergency Department (HOSPITAL_COMMUNITY)
Admission: EM | Admit: 2016-02-17 | Discharge: 2016-02-17 | Disposition: A | Discharge status: Home / Self Care | Payer: Medicaid Other | Attending: Emergency Medicine | Admitting: Emergency Medicine

## 2016-02-17 DIAGNOSIS — L0291 Cutaneous abscess, unspecified: Secondary | ICD-10-CM

## 2016-02-17 DIAGNOSIS — L03116 Cellulitis of left lower limb: Secondary | ICD-10-CM | POA: Diagnosis not present

## 2016-02-17 DIAGNOSIS — Z87891 Personal history of nicotine dependence: Secondary | ICD-10-CM | POA: Diagnosis not present

## 2016-02-17 DIAGNOSIS — L03311 Cellulitis of abdominal wall: Secondary | ICD-10-CM | POA: Insufficient documentation

## 2016-02-17 DIAGNOSIS — L02211 Cutaneous abscess of abdominal wall: Secondary | ICD-10-CM | POA: Insufficient documentation

## 2016-02-17 DIAGNOSIS — L03312 Cellulitis of back [any part except buttock]: Secondary | ICD-10-CM

## 2016-02-17 DIAGNOSIS — L02416 Cutaneous abscess of left lower limb: Secondary | ICD-10-CM | POA: Insufficient documentation

## 2016-02-17 MED ORDER — CLINDAMYCIN HCL 300 MG PO CAPS
300.0000 mg | ORAL_CAPSULE | Freq: Once | ORAL | Status: AC
Start: 2016-02-17 — End: 2016-02-17
  Administered 2016-02-17: 300 mg via ORAL
  Filled 2016-02-17: qty 1

## 2016-02-17 MED ORDER — LIDOCAINE-EPINEPHRINE (PF) 1 %-1:200000 IJ SOLN
10.0000 mL | Freq: Once | INTRAMUSCULAR | Status: AC
  Administered 2016-02-17: 10 mL
  Filled 2016-02-17: qty 30

## 2016-02-17 MED ORDER — CLINDAMYCIN HCL 150 MG PO CAPS: 300.0000 mg | ORAL_CAPSULE | Freq: Three times a day (TID) | ORAL | 0 refills | Status: AC

## 2016-02-17 NOTE — ED Provider Notes (Signed)
[facility]-EMERGENCY DEPT Provider Note   CSN: [id number] Arrival date & time: 02/17/16  1007  First Provider Contact:  First MD Initiated Contact with Patient 02/17/16 1057        History   Chief Complaint Chief Complaint  Patient presents with  . Abscess    HPI Shelby Nichols is a 25 y.o. female.  HPI   A few days ago, started out as small pimple/vs ingrown hair, not sure if bit, but they grew in size and now larger, 3cm areas of redness and pain, sever pain10/10, has one on lower back, one above left knee.  Back abscess is larger. Hx of one prior drainage last year of abscess.  No other medical problems.  No hx of IVDU, no hx of DM.   Past Medical History:  Diagnosis Date  . Chlamydia   . Trichomonas 04/25/2011    Patient Active Problem List   Diagnosis Date Noted  . NVD (normal vaginal delivery) 10/03/2015    Past Surgical History:  Procedure Laterality Date  . VAGINAL DELIVERY      OB History    Gravida Para Term Preterm AB Living   6 6 5 1  0 6   SAB TAB Ectopic Multiple Live Births   0 0 0 0         Home Medications    Prior to Admission medications   Medication Sig Start Date End Date Taking? Authorizing Provider  clindamycin (CLEOCIN) 150 MG capsule Take 2 capsules (300 mg total) by mouth 3 (three) times daily. 02/17/16 02/24/16  Alvira Monday, MD    Family History Family History  Problem Relation Age of Onset  . Cancer Neg Hx   . Diabetes Neg Hx   . Hypertension Neg Hx     Social History Social History  Substance Use Topics  . Smoking status: Former Smoker    Packs/day: 0.10    Years: 3.00    Types: Cigarettes  . Smokeless tobacco: Never Used  . Alcohol use Yes     Comment: social, not while pregnant     Allergies   Review of patient's allergies indicates no known allergies.   Review of Systems Review of Systems  Constitutional: Positive for chills and diaphoresis (last night was sweating in sleep, not sure if had fever but felt  hot). Negative for fever.  HENT: Negative for sore throat.   Eyes: Negative for visual disturbance.  Respiratory: Negative for cough and shortness of breath.   Cardiovascular: Negative for chest pain.  Gastrointestinal: Negative for abdominal pain.  Genitourinary: Negative for difficulty urinating.  Musculoskeletal: Positive for back pain. Negative for neck pain.  Skin: Positive for rash and wound.  Neurological: Negative for syncope and headaches.     Physical Exam Updated Vital Signs BP 112/74 (BP Location: Right Arm)   Pulse 82   Temp 98.8 F (37.1 C) (Oral)   Resp 16   SpO2 100%   Physical Exam  Constitutional: She is oriented to person, place, and time. She appears well-developed and well-nourished. No distress.  HENT:  Head: Normocephalic and atraumatic.  Eyes: Conjunctivae and EOM are normal.  Neck: Normal range of motion.  Cardiovascular: Normal rate, regular rhythm, normal heart sounds and intact distal pulses.  Exam reveals no gallop and no friction rub.   No murmur heard. Pulmonary/Chest: Effort normal and breath sounds normal. No respiratory distress. She has no wheezes. She has no rales.  Abdominal: Soft. She exhibits no distension. There is no tenderness.  There is no guarding.  Musculoskeletal: She exhibits no edema.       Left knee: She exhibits normal range of motion.       Lumbar back: She exhibits tenderness (right lower back over area of induration). She exhibits no bony tenderness.  Neurological: She is alert and oriented to person, place, and time.  Skin: Skin is warm and dry. No rash noted. She is not diaphoretic. No erythema.  Lower back on right with 3cm area of fluctuance with elliptical erythema and induration surrounding area measuring total 8cm  3cm erythema lower anterior thigh  Nursing note and vitals reviewed.    ED Treatments / Results  Labs (all labs ordered are listed, but only abnormal results are displayed) Labs Reviewed - No data to  display  EKG  EKG Interpretation None       Radiology No results found.  Procedures .Marland KitchenIncision and Drainage Date/Time: 02/17/2016 11:27 PM Performed by: Alvira Monday Authorized by: Alvira Monday   Consent:    Consent obtained:  Verbal Location:    Type:  Abscess   Size:  3   Location:  Trunk   Trunk location:  Back Pre-procedure details:    Skin preparation:  Betadine Anesthesia (see MAR for exact dosages):    Anesthesia method:  Local infiltration Procedure type:    Complexity:  Simple Procedure details:    Incision types:  Single straight   Scalpel blade:  11   Drainage:  Purulent and bloody   Drainage amount:  Copious   Wound treatment:  Wound left open   Packing materials:  1/4 in iodoform gauze Post-procedure details:    Patient tolerance of procedure:  Tolerated well, no immediate complications .Marland KitchenIncision and Drainage Date/Time: 02/17/2016 11:29 PM Performed by: Alvira Monday Authorized by: Alvira Monday   Consent:    Consent obtained:  Verbal Location:    Type:  Abscess   Size:  2   Location:  Lower extremity   Lower extremity location:  Leg   Leg location:  L upper leg Pre-procedure details:    Skin preparation:  Betadine Anesthesia (see MAR for exact dosages):    Anesthesia method:  Local infiltration   Local anesthetic:  Lidocaine 2% WITH epi Procedure type:    Complexity:  Simple Procedure details:    Incision types:  Single straight   Scalpel blade:  11   Wound management:  Probed and deloculated   Drainage:  Purulent and bloody   Drainage amount:  Moderate   Wound treatment:  Wound left open   Packing materials:  1/4 in iodoform gauze Post-procedure details:    Patient tolerance of procedure:  Tolerated well, no immediate complications   (including critical care time)  Medications Ordered in ED Medications  lidocaine-EPINEPHrine (XYLOCAINE-EPINEPHrine) 1 %-1:200000 (PF) injection 10 mL (10 mLs Infiltration Given  02/17/16 1104)  clindamycin (CLEOCIN) capsule 300 mg (300 mg Oral Given 02/17/16 1158)     Initial Impression / Assessment and Plan / ED Course  I have reviewed the triage vital signs and the nursing notes.  Pertinent labs & imaging results that were available during my care of the patient were reviewed by me and considered in my medical decision making (see chart for details).  Clinical Course   25yo female with history of prior abscess presents with concern for pain and swelling in area of right lower back and left knee. Incision and drainage performed on both with copious purulent drainage from abscess on back, moderate from smaller  abscess in knee.  Given surrounding erythema, given abx for cellulitis and 2 abscesses.  Pt without fevers, hemodynamically stable. Recommend wound check in 2 days and packing removal. Patient discharged in stable condition with understanding of reasons to return.   Final Clinical Impressions(s) / ED Diagnoses   Final diagnoses:  Abscess  Cellulitis of back except buttock    New Prescriptions Discharge Medication List as of 02/17/2016 11:38 AM    START taking these medications   Details  clindamycin (CLEOCIN) 150 MG capsule Take 2 capsules (300 mg total) by mouth 3 (three) times daily., Starting Thu 02/17/2016, Until Thu 02/24/2016, Print         Alvira Monday, MD 02/17/16 [phone]

## 2016-02-17 NOTE — ED Triage Notes (Signed)
Pt has area of redness and swelling on R lower back. Pierced area with a needle at home and reports put came out. Also has area of redness and swelling just above L knee

## 2016-07-24 NOTE — L&D Delivery Note (Signed)
Patient is a 26 y.o. now [id number] s/p NSVD at 7925w5d, who was admitted for PPROM earlier today (but ROM yesterday at 11am). She received one dose of BMZ. Started on PCN for GBS prophylaxis. Labor induced with Pitocin. Progressed well and dilation complete are 21:30.  Delivery Note At 9:40 PM a viable female was delivered via  SVD (Presentation:vertex ; LOA ).  APGAR: pending; weight  pending.   Placenta status: intact.  Cord:  3-vessel Anesthesia:  Epidural Episiotomy:   Lacerations:  Periurethral abrasions Suture Repair: none Est. Blood Loss (mL):  50  Head delivered LOA. No nuchal cord present. Shoulder and body delivered in usual fashion. Infant with spontaneous cry, placed on mother's abdomen, dried and bulb suctioned. Cord clamped x 2 after 1-minute delay, and cut by family member. Cord blood drawn. Placenta delivered spontaneously with gentle cord traction. Fundus firm with massage and Pitocin. Perineum inspected and found to have no lacerations other than periurethral abrasions.  Mom to postpartum.  Baby to Couplet care / Skin to Skin.  Raynelle FanningJulie P. Lettie Czarnecki, MD OB Fellow 07/21/17, 9:56 PM

## 2017-03-07 ENCOUNTER — Inpatient Hospital Stay (HOSPITAL_COMMUNITY)
Admission: AD | Admit: 2017-03-07 | Discharge: 2017-03-07 | Disposition: A | Discharge status: Home / Self Care | Payer: Medicaid Other | Source: Ambulatory Visit | Attending: Family Medicine | Admitting: Family Medicine

## 2017-03-07 ENCOUNTER — Encounter (HOSPITAL_COMMUNITY): Payer: Self-pay | Admitting: *Deleted

## 2017-03-07 DIAGNOSIS — O9989 Other specified diseases and conditions complicating pregnancy, childbirth and the puerperium: Secondary | ICD-10-CM

## 2017-03-07 DIAGNOSIS — Z87891 Personal history of nicotine dependence: Secondary | ICD-10-CM | POA: Diagnosis not present

## 2017-03-07 DIAGNOSIS — Z3A20 20 weeks gestation of pregnancy: Secondary | ICD-10-CM | POA: Insufficient documentation

## 2017-03-07 DIAGNOSIS — Z3482 Encounter for supervision of other normal pregnancy, second trimester: Secondary | ICD-10-CM

## 2017-03-07 DIAGNOSIS — R300 Dysuria: Secondary | ICD-10-CM | POA: Diagnosis present

## 2017-03-07 DIAGNOSIS — O23592 Infection of other part of genital tract in pregnancy, second trimester: Secondary | ICD-10-CM | POA: Diagnosis not present

## 2017-03-07 DIAGNOSIS — N760 Acute vaginitis: Secondary | ICD-10-CM

## 2017-03-07 DIAGNOSIS — B9689 Other specified bacterial agents as the cause of diseases classified elsewhere: Secondary | ICD-10-CM | POA: Diagnosis not present

## 2017-03-07 LAB — URINALYSIS, ROUTINE W REFLEX MICROSCOPIC
BILIRUBIN URINE: NEGATIVE
Glucose, UA: NEGATIVE mg/dL
Hgb urine dipstick: NEGATIVE
Ketones, ur: NEGATIVE mg/dL
NITRITE: NEGATIVE
PH: 8 (ref 5.0–8.0)
Protein, ur: NEGATIVE mg/dL
SPECIFIC GRAVITY, URINE: 1.018 (ref 1.005–1.030)

## 2017-03-07 LAB — WET PREP, GENITAL
SPERM: NONE SEEN
Trich, Wet Prep: NONE SEEN
Yeast Wet Prep HPF POC: NONE SEEN

## 2017-03-07 MED ORDER — METRONIDAZOLE 500 MG PO TABS: 500.0000 mg | ORAL_TABLET | Freq: Two times a day (BID) | ORAL | 0 refills | Status: DC

## 2017-03-07 MED ORDER — VITAFOL ULTRA 29-0.6-0.4-200 MG PO CAPS: 1.0000 | ORAL_CAPSULE | Freq: Every day | ORAL | 12 refills | Status: AC

## 2017-03-07 NOTE — MAU Note (Addendum)
Noted specs of blood in urine this morning. 2nd time this has happened, didn't come in because it went away.  Having mild pain at the bottom of her belly. No prenatal care, has not been seen anywhere yet.

## 2017-03-07 NOTE — MAU Provider Note (Signed)
History     CSN: [id number]  Arrival date and time: 03/07/17 1150   None     Chief Complaint  Patient presents with  . Dysuria  . Hematuria   HPI 26 yo [id number] at [redacted]w[redacted]d by LMP presenting today for the evaluation of hematuria. Patient reports noticing a few drops of blood following urination today. She reports that a similar episode occurred a month ago. She has not started prenatal care for this pregnancy. She is a former patient of Dr. Gaynell Face. She also reports some lower abdominal suprapubic pain. She denies frequency or urgency. She denies vaginal bleeding, leakage of fluid or abnormal discharge. She does not feel fetal movement  OB History    Gravida Para Term Preterm AB Living   7 6 5 1  0 6   SAB TAB Ectopic Multiple Live Births   0 0 0 0 6      Past Medical History:  Diagnosis Date  . Chlamydia   . Trichomonas 04/25/2011    Past Surgical History:  Procedure Laterality Date  . NO PAST SURGERIES    . VAGINAL DELIVERY      Family History  Problem Relation Age of Onset  . Cancer Neg Hx   . Diabetes Neg Hx   . Hypertension Neg Hx     Social History  Substance Use Topics  . Smoking status: Former Smoker    Packs/day: 0.10    Years: 3.00    Types: Cigarettes  . Smokeless tobacco: Never Used  . Alcohol use Yes     Comment: social, not while pregnant    Allergies: No Known Allergies  No prescriptions prior to admission.    Review of Systems  See pertinent in HPI Physical Exam   Blood pressure 115/63, pulse 89, temperature 98.6 F (37 C), resp. rate 16, last menstrual period 11/30/2016, unknown if currently breastfeeding.  Physical Exam GENERAL: Well-developed, well-nourished female in no acute distress.  ABDOMEN: Soft, nontender, gravid. Fundal height 20cm PELVIC: Normal external female genitalia. Vagina is pink and rugated.  Normal discharge. Normal appearing cervix. Cervix is closed and long EXTREMITIES: No cyanosis, clubbing, or edema, 2+  distal pulses.  MAU Course  Procedures  MDM Bedside ultrasound: active fetus with strong heart rate appearing to be 20 weeks  Results for orders placed or performed during the hospital encounter of 03/07/17 (from the past 24 hour(s))  Urinalysis, Routine w reflex microscopic     Status: Abnormal   Collection Time: 03/07/17 12:32 PM  Result Value Ref Range   Color, Urine YELLOW YELLOW   APPearance CLOUDY (A) CLEAR   Specific Gravity, Urine 1.018 1.005 - 1.030   pH 8.0 5.0 - 8.0   Glucose, UA NEGATIVE NEGATIVE mg/dL   Hgb urine dipstick NEGATIVE NEGATIVE   Bilirubin Urine NEGATIVE NEGATIVE   Ketones, ur NEGATIVE NEGATIVE mg/dL   Protein, ur NEGATIVE NEGATIVE mg/dL   Nitrite NEGATIVE NEGATIVE   Leukocytes, UA TRACE (A) NEGATIVE   RBC / HPF 0-5 0 - 5 RBC/hpf   WBC, UA 6-30 0 - 5 WBC/hpf   Bacteria, UA RARE (A) NONE SEEN   Squamous Epithelial / LPF 6-30 (A) NONE SEEN   Mucous PRESENT   Wet prep, genital     Status: Abnormal   Collection Time: 03/07/17  1:39 PM  Result Value Ref Range   Yeast Wet Prep HPF POC NONE SEEN NONE SEEN   Trich, Wet Prep NONE SEEN NONE SEEN   Clue Cells Wet Prep HPF  POC PRESENT (A) NONE SEEN   WBC, Wet Prep HPF POC FEW (A) NONE SEEN   Sperm NONE SEEN      Assessment and Plan  [age] yo at approximately 20 week of pregnancy with BV - Rx Flagyl provided - Urine culture ordered - Patient plans to establish care with [facility] (information provided) - Anatomy ultrasound ordered - Patient advised to strat prenatal vitamins - Precautions reviewed  Tomoya Ringwald 03/07/2017, 1:55 PM

## 2017-03-07 NOTE — Discharge Instructions (Signed)
Bacterial Vaginosis Bacterial vaginosis is a vaginal infection that occurs when the normal balance of bacteria in the vagina is disrupted. It results from an overgrowth of certain bacteria. This is the most common vaginal infection among women ages 15-44. Because bacterial vaginosis increases your risk for STIs (sexually transmitted infections), getting treated can help reduce your risk for chlamydia, gonorrhea, herpes, and HIV (human immunodeficiency virus). Treatment is also important for preventing complications in pregnant women, because this condition can cause an early (premature) delivery. What are the causes? This condition is caused by an increase in harmful bacteria that are normally present in small amounts in the vagina. However, the reason that the condition develops is not fully understood. What increases the risk? The following factors may make you more likely to develop this condition:  Having a new sexual partner or multiple sexual partners.  Having unprotected sex.  Douching.  Having an intrauterine device (IUD).  Smoking.  Drug and alcohol abuse.  Taking certain antibiotic medicines.  Being pregnant.  You cannot get bacterial vaginosis from toilet seats, bedding, swimming pools, or contact with objects around you. What are the signs or symptoms? Symptoms of this condition include:  Grey or [place] vaginal discharge. The discharge can also be watery or foamy.  A fish-like odor with discharge, especially after sexual intercourse or during menstruation.  Itching in and around the vagina.  Burning or pain with urination.  Some women with bacterial vaginosis have no signs or symptoms. How is this diagnosed? This condition is diagnosed based on:  Your medical history.  A physical exam of the vagina.  Testing a sample of vaginal fluid under a microscope to look for a large amount of bad bacteria or abnormal cells. Your health care provider may use a cotton swab  or a small wooden spatula to collect the sample.  How is this treated? This condition is treated with antibiotics. These may be given as a pill, a vaginal cream, or a medicine that is put into the vagina (suppository). If the condition comes back after treatment, a second round of antibiotics may be needed. Follow these instructions at home: Medicines  Take over-the-counter and prescription medicines only as told by your health care provider.  Take or use your antibiotic as told by your health care provider. Do not stop taking or using the antibiotic even if you start to feel better. General instructions  If you have a female sexual partner, tell her that you have a vaginal infection. She should see her health care provider and be treated if she has symptoms. If you have a female sexual partner, he does not need treatment.  During treatment: ? Avoid sexual activity until you finish treatment. ? Do not douche. ? Avoid alcohol as directed by your health care provider. ? Avoid breastfeeding as directed by your health care provider.  Drink enough water and fluids to keep your urine clear or pale yellow.  Keep the area around your vagina and rectum clean. ? Wash the area daily with warm water. ? Wipe yourself from front to back after using the toilet.  Keep all follow-up visits as told by your health care provider. This is important. How is this prevented?  Do not douche.  Wash the outside of your vagina with warm water only.  Use protection when having sex. This includes latex condoms and dental dams.  Limit how many sexual partners you have. To help prevent bacterial vaginosis, it is best to have sex with just   one partner (monogamous).  Make sure you and your sexual partner are tested for STIs.  Wear cotton or cotton-lined underwear.  Avoid wearing tight pants and pantyhose, especially during summer.  Limit the amount of alcohol that you drink.  Do not use any products that  contain nicotine or tobacco, such as cigarettes and e-cigarettes. If you need help quitting, ask your health care provider.  Do not use illegal drugs. Where to find more information:  Centers for Disease Control and Prevention: www.cdc.gov/std  American Sexual Health Association (ASHA): www.ashastd.org  [place] Department of Health and Human Services, Office on Women's Health: www.womenshealth.gov/ or https://www.womenshealth.gov/a-z-topics/bacterial-vaginosis Contact a health care provider if:  Your symptoms do not improve, even after treatment.  You have more discharge or pain when urinating.  You have a fever.  You have pain in your abdomen.  You have pain during sex.  You have vaginal bleeding between periods. Summary  Bacterial vaginosis is a vaginal infection that occurs when the normal balance of bacteria in the vagina is disrupted.  Because bacterial vaginosis increases your risk for STIs (sexually transmitted infections), getting treated can help reduce your risk for chlamydia, gonorrhea, herpes, and HIV (human immunodeficiency virus). Treatment is also important for preventing complications in pregnant women, because the condition can cause an early (premature) delivery.  This condition is treated with antibiotic medicines. These may be given as a pill, a vaginal cream, or a medicine that is put into the vagina (suppository). This information is not intended to replace advice given to you by your health care provider. Make sure you discuss any questions you have with your health care provider. Document Released: 07/10/2005 Document Revised: 03/25/2016 Document Reviewed: 03/25/2016 Elsevier Interactive Patient Education  2017 [employer]  

## 2017-03-07 NOTE — MAU Note (Signed)
Top of fundus is at umbilicus

## 2017-03-08 ENCOUNTER — Ambulatory Visit (HOSPITAL_COMMUNITY)
Admission: RE | Admit: 2017-03-08 | Discharge: 2017-03-08 | Disposition: A | Discharge status: Home / Self Care | Payer: Medicaid Other | Source: Ambulatory Visit | Attending: Obstetrics and Gynecology | Admitting: Obstetrics and Gynecology

## 2017-03-08 ENCOUNTER — Other Ambulatory Visit (HOSPITAL_COMMUNITY): Payer: Self-pay | Admitting: Obstetrics and Gynecology

## 2017-03-08 DIAGNOSIS — O0932 Supervision of pregnancy with insufficient antenatal care, second trimester: Secondary | ICD-10-CM

## 2017-03-08 DIAGNOSIS — Z3482 Encounter for supervision of other normal pregnancy, second trimester: Secondary | ICD-10-CM

## 2017-03-08 DIAGNOSIS — O99212 Obesity complicating pregnancy, second trimester: Secondary | ICD-10-CM

## 2017-03-08 DIAGNOSIS — Z3A17 17 weeks gestation of pregnancy: Secondary | ICD-10-CM | POA: Diagnosis not present

## 2017-03-08 DIAGNOSIS — Z363 Encounter for antenatal screening for malformations: Secondary | ICD-10-CM | POA: Diagnosis not present

## 2017-03-08 DIAGNOSIS — Z3687 Encounter for antenatal screening for uncertain dates: Secondary | ICD-10-CM | POA: Diagnosis not present

## 2017-03-08 LAB — CULTURE, OB URINE: Culture: NO GROWTH

## 2017-03-29 ENCOUNTER — Encounter: Payer: Medicaid Other | Admitting: Obstetrics

## 2017-04-23 ENCOUNTER — Encounter: Payer: Medicaid Other | Admitting: Obstetrics & Gynecology

## 2017-05-02 ENCOUNTER — Encounter: Payer: Self-pay | Admitting: Obstetrics and Gynecology

## 2017-05-03 ENCOUNTER — Encounter: Payer: Medicaid Other | Admitting: Obstetrics and Gynecology

## 2017-05-18 DIAGNOSIS — Z3490 Encounter for supervision of normal pregnancy, unspecified, unspecified trimester: Secondary | ICD-10-CM | POA: Insufficient documentation

## 2017-05-21 ENCOUNTER — Other Ambulatory Visit (HOSPITAL_COMMUNITY)
Admission: RE | Admit: 2017-05-21 | Discharge: 2017-05-21 | Disposition: A | Discharge status: Home / Self Care | Payer: Medicaid Other | Source: Ambulatory Visit | Attending: Obstetrics | Admitting: Obstetrics

## 2017-05-21 ENCOUNTER — Encounter: Payer: Self-pay | Admitting: Obstetrics

## 2017-05-21 ENCOUNTER — Ambulatory Visit (INDEPENDENT_AMBULATORY_CARE_PROVIDER_SITE_OTHER): Payer: Medicaid Other | Admitting: Obstetrics

## 2017-05-21 VITALS — BP 118/70 | HR 108 | Wt 185.0 lb

## 2017-05-21 DIAGNOSIS — B373 Candidiasis of vulva and vagina: Secondary | ICD-10-CM | POA: Diagnosis not present

## 2017-05-21 DIAGNOSIS — M549 Dorsalgia, unspecified: Secondary | ICD-10-CM

## 2017-05-21 DIAGNOSIS — Z3490 Encounter for supervision of normal pregnancy, unspecified, unspecified trimester: Secondary | ICD-10-CM | POA: Insufficient documentation

## 2017-05-21 DIAGNOSIS — Z3493 Encounter for supervision of normal pregnancy, unspecified, third trimester: Secondary | ICD-10-CM | POA: Diagnosis not present

## 2017-05-21 MED ORDER — COMFORT FIT MATERNITY SUPP SM MISC: 1.0000 "application " | Freq: Every day | 0 refills | Status: DC

## 2017-05-21 NOTE — Progress Notes (Signed)
Patient is in the office for initial ob visit, reports good fetal movement, denies pain. Pt states that she was unable to get care before now due to transportation issues.

## 2017-05-21 NOTE — Progress Notes (Signed)
Subjective:    Shelby Nichols is being seen today for her first obstetrical visit.  This is not a planned pregnancy. She is at [redacted]w[redacted]d gestation. Her obstetrical history is significant for none. Relationship with FOB: living together, involved. Patient does intend to breast feed. Pregnancy history fully reviewed.  The information documented in the HPI was reviewed and verified.  Menstrual History: OB History    Gravida Para Term Preterm AB Living   7 6 5 1  0 6   SAB TAB Ectopic Multiple Live Births   0 0 0 0 6       Patient's last menstrual period was 11/30/2016.    Past Medical History:  Diagnosis Date  . Chlamydia   . Trichomonas 04/25/2011    Past Surgical History:  Procedure Laterality Date  . NO PAST SURGERIES    . VAGINAL DELIVERY       (Not in a hospital admission) No Known Allergies  Social History  Substance Use Topics  . Smoking status: Former Smoker    Packs/day: 0.10    Years: 3.00    Types: Cigarettes  . Smokeless tobacco: Never Used  . Alcohol use Yes     Comment: social, not while pregnant    Family History  Problem Relation Age of Onset  . Cancer Neg Hx   . Diabetes Neg Hx   . Hypertension Neg Hx      Review of Systems Constitutional: negative for weight loss Gastrointestinal: negative for vomiting Genitourinary:negative for genital lesions and vaginal discharge and dysuria Musculoskeletal:negative for back pain Behavioral/Psych: negative for abusive relationship, depression, illegal drug usage and tobacco use    Objective:    BP 118/70   Pulse (!) 108   Wt 185 lb (83.9 kg)   LMP 11/30/2016   BMI 36.13 kg/m  General Appearance:    Alert, cooperative, no distress, appears stated age  Head:    Normocephalic, without obvious abnormality, atraumatic  Eyes:    PERRL, conjunctiva/corneas clear, EOM's intact, fundi    benign, both eyes  Ears:    Normal TM's and external ear canals, both ears  Nose:   Nares normal, septum midline, mucosa  normal, no drainage    or sinus tenderness  Throat:   Lips, mucosa, and tongue normal; teeth and gums normal  Neck:   Supple, symmetrical, trachea midline, no adenopathy;    thyroid:  no enlargement/tenderness/nodules; no carotid   bruit or JVD  Back:     Symmetric, no curvature, ROM normal, no CVA tenderness  Lungs:     Clear to auscultation bilaterally, respirations unlabored  Chest Wall:    No tenderness or deformity   Heart:    Regular rate and rhythm, S1 and S2 normal, no murmur, rub   or gallop  Breast Exam:    No tenderness, masses, or nipple abnormality  Abdomen:     Soft, non-tender, bowel sounds active all four quadrants,    no masses, no organomegaly  Genitalia:    Normal female without lesion, discharge or tenderness  Extremities:   Extremities normal, atraumatic, no cyanosis or edema  Pulses:   2+ and symmetric all extremities  Skin:   Skin color, texture, turgor normal, no rashes or lesions  Lymph nodes:   Cervical, supraclavicular, and axillary nodes normal  Neurologic:   CNII-XII intact, normal strength, sensation and reflexes    throughout      Lab Review Urine pregnancy test Labs reviewed yes Radiologic studies reviewed yes Assessment:  Pregnancy at 10361w0d weeks    Plan:     1. Encounter for supervision of normal pregnancy, antepartum, unspecified gravidity Rx: - Obstetric Panel, Including HIV - Hemoglobinopathy evaluation - Cystic Fibrosis Mutation 97 - Culture, OB Urine - Cervicovaginal ancillary only - Cytology - PAP - Varicella zoster antibody, IgG - US MFM OB FOLLOW UP; Future  2. Backache symptom Rx: - Elastic Bandages & Supports (COMFORT FIT MATERNITY SUPP SM) MISC; 1 application by Does not apply route daily.  Dispense: 1 each; Refill: 0  Prenatal vitamins.  Counseling provided regarding continued use of seat belts, cessation of alcohol consumption, smoking or use of illicit drugs; infection precautions i.e., influenza/TDAP immunizations,  toxoplasmosis,CMV, parvovirus, listeria and varicella; workplace safety, exercise during pregnancy; routine dental care, safe medications, sexual activity, hot tubs, saunas, pools, travel, caffeine use, fish and methlymercury, potential toxins, hair treatments, varicose veins Weight gain recommendations per IOM guidelines reviewed: underweight/BMI< 18.5--> gain 28 - 40 lbs; normal weight/BMI 18.5 - 24.9--> gain 25 - 35 lbs; overweight/BMI 25 - 29.9--> gain 15 - 25 lbs; obese/BMI >30->gain  11 - 20 lbs Problem list reviewed and updated. FIRST/CF mutation testing/NIPT/QUAD SCREEN/fragile X/Ashkenazi Jewish population testing/Spinal muscular atrophy discussed: results pending Role of ultrasound in pregnancy discussed; fetal survey: requested. Amniocentesis discussed: not indicated. VBAC calculator score: VBAC consent form provided Meds ordered this encounter  Medications  . Elastic Bandages & Supports (COMFORT FIT MATERNITY SUPP SM) MISC    Sig: 1 application by Does not apply route daily.    Dispense:  1 each    Refill:  0   Orders Placed This Encounter  Procedures  . Culture, OB Urine  . Obstetric Panel, Including HIV  . Hemoglobinopathy evaluation  . Cystic Fibrosis Mutation 97  . Varicella zoster antibody, IgG    Follow up in 2 weeks. 50% of 20 min visit spent on counseling and coordination of care.

## 2017-05-22 LAB — CYTOLOGY - PAP: Diagnosis: NEGATIVE

## 2017-05-22 LAB — CERVICOVAGINAL ANCILLARY ONLY
BACTERIAL VAGINITIS: NEGATIVE
CANDIDA VAGINITIS: POSITIVE — AB
CHLAMYDIA, DNA PROBE: NEGATIVE
NEISSERIA GONORRHEA: NEGATIVE
Trichomonas: NEGATIVE

## 2017-05-23 ENCOUNTER — Other Ambulatory Visit: Payer: Self-pay | Admitting: Obstetrics

## 2017-05-23 DIAGNOSIS — B3731 Acute candidiasis of vulva and vagina: Secondary | ICD-10-CM

## 2017-05-23 DIAGNOSIS — B373 Candidiasis of vulva and vagina: Secondary | ICD-10-CM

## 2017-05-23 LAB — CULTURE, OB URINE

## 2017-05-23 LAB — URINE CULTURE, OB REFLEX

## 2017-05-23 MED ORDER — FLUCONAZOLE 150 MG PO TABS: 150.0000 mg | ORAL_TABLET | Freq: Once | ORAL | 0 refills | Status: AC

## 2017-05-25 LAB — OBSTETRIC PANEL, INCLUDING HIV
BASOS ABS: 0 10*3/uL (ref 0.0–0.2)
Basos: 0 %
EOS (ABSOLUTE): 0.1 10*3/uL (ref 0.0–0.4)
Eos: 1 %
HEP B S AG: NEGATIVE
HIV SCREEN 4TH GENERATION: NONREACTIVE
Hematocrit: 37.1 % (ref 34.0–46.6)
Hemoglobin: 12.2 g/dL (ref 11.1–15.9)
IMMATURE GRANULOCYTES: 1 %
Immature Grans (Abs): 0.1 10*3/uL (ref 0.0–0.1)
LYMPHS: 24 %
Lymphocytes Absolute: 2.3 10*3/uL (ref 0.7–3.1)
MCH: 30.5 pg (ref 26.6–33.0)
MCHC: 32.9 g/dL (ref 31.5–35.7)
MCV: 93 fL (ref 79–97)
MONOCYTES: 6 %
Monocytes Absolute: 0.5 10*3/uL (ref 0.1–0.9)
NEUTROS ABS: 6.4 10*3/uL (ref 1.4–7.0)
NEUTROS PCT: 68 %
PLATELETS: 285 10*3/uL (ref 150–379)
RBC: 4 x10E6/uL (ref 3.77–5.28)
RDW: 13.4 % (ref 12.3–15.4)
RPR Ser Ql: NONREACTIVE
RUBELLA: 1.2 {index} (ref >0.99)
Rh Factor: POSITIVE
WBC: 9.4 10*3/uL (ref 3.4–10.8)

## 2017-05-25 LAB — HEMOGLOBINOPATHY EVALUATION
HEMOGLOBIN A2 QUANTITATION: 2.3 % (ref 1.8–3.2)
HEMOGLOBIN F QUANTITATION: 0 % (ref 0.0–2.0)
HGB A: 97.7 % (ref 96.4–98.8)
HGB C: 0 %
HGB S: 0 %
HGB VARIANT: 0 %

## 2017-05-25 LAB — AB SCR+ANTIBODY ID: ANTIBODY SCREEN: POSITIVE — AB

## 2017-05-25 LAB — VARICELLA ZOSTER ANTIBODY, IGG: Varicella zoster IgG: 648 index (ref >165)

## 2017-05-29 ENCOUNTER — Other Ambulatory Visit: Payer: Self-pay | Admitting: Obstetrics

## 2017-05-29 DIAGNOSIS — O361930 Maternal care for other isoimmunization, third trimester, not applicable or unspecified: Secondary | ICD-10-CM

## 2017-05-29 LAB — CYSTIC FIBROSIS MUTATION 97: GENE DIS ANAL CARRIER INTERP BLD/T-IMP: NOT DETECTED

## 2017-05-30 ENCOUNTER — Ambulatory Visit (HOSPITAL_COMMUNITY)
Admission: RE | Admit: 2017-05-30 | Discharge: 2017-05-30 | Disposition: A | Discharge status: Home / Self Care | Payer: Medicaid Other | Source: Ambulatory Visit | Attending: Obstetrics | Admitting: Obstetrics

## 2017-05-30 DIAGNOSIS — O99213 Obesity complicating pregnancy, third trimester: Secondary | ICD-10-CM | POA: Insufficient documentation

## 2017-05-30 DIAGNOSIS — Z3493 Encounter for supervision of normal pregnancy, unspecified, third trimester: Secondary | ICD-10-CM | POA: Diagnosis present

## 2017-05-30 DIAGNOSIS — Z362 Encounter for other antenatal screening follow-up: Secondary | ICD-10-CM | POA: Insufficient documentation

## 2017-05-30 DIAGNOSIS — O0930 Supervision of pregnancy with insufficient antenatal care, unspecified trimester: Secondary | ICD-10-CM | POA: Insufficient documentation

## 2017-05-30 DIAGNOSIS — Z3A29 29 weeks gestation of pregnancy: Secondary | ICD-10-CM | POA: Insufficient documentation

## 2017-05-30 DIAGNOSIS — Z3490 Encounter for supervision of normal pregnancy, unspecified, unspecified trimester: Secondary | ICD-10-CM

## 2017-06-04 ENCOUNTER — Encounter: Payer: Medicaid Other | Admitting: Obstetrics

## 2017-06-06 ENCOUNTER — Ambulatory Visit (INDEPENDENT_AMBULATORY_CARE_PROVIDER_SITE_OTHER): Payer: Medicaid Other | Admitting: Obstetrics

## 2017-06-06 VITALS — BP 111/70 | HR 87 | Wt 193.6 lb

## 2017-06-06 DIAGNOSIS — Z3483 Encounter for supervision of other normal pregnancy, third trimester: Secondary | ICD-10-CM

## 2017-06-06 DIAGNOSIS — Z23 Encounter for immunization: Secondary | ICD-10-CM

## 2017-06-06 DIAGNOSIS — Z3490 Encounter for supervision of normal pregnancy, unspecified, unspecified trimester: Secondary | ICD-10-CM

## 2017-06-07 ENCOUNTER — Encounter: Payer: Self-pay | Admitting: *Deleted

## 2017-06-07 ENCOUNTER — Encounter: Payer: Self-pay | Admitting: Obstetrics

## 2017-06-07 NOTE — Progress Notes (Signed)
Subjective:  Kayren EavesMartika J Pacetti is a 26 y.o. G7P5106 at 11015w3d being seen today for ongoing prenatal care.  She is currently monitored for the following issues for this low-risk pregnancy and has NVD (normal vaginal delivery) and Supervision of normal pregnancy, antepartum on their problem list.  Patient reports no complaints.  Contractions: Irregular. Vag. Bleeding: None.  Movement: Present. Denies leaking of fluid.   The following portions of the patient's history were reviewed and updated as appropriate: allergies, current medications, past family history, past medical history, past social history, past surgical history and problem list. Problem list updated.  Objective:   Vitals:   06/06/17 1520  BP: 111/70  Pulse: 87  Weight: 193 lb 9.6 oz (87.8 kg)    Fetal Status: Fetal Heart Rate (bpm): 150   Movement: Present     General:  Alert, oriented and cooperative. Patient is in no acute distress.  Skin: Skin is warm and dry. No rash noted.   Cardiovascular: Normal heart rate noted  Respiratory: Normal respiratory effort, no problems with respiration noted  Abdomen: Soft, gravid, appropriate for gestational age. Pain/Pressure: Absent     Pelvic:  Cervical exam deferred        Extremities: Normal range of motion.  Edema: Trace  Mental Status: Normal mood and affect. Normal behavior. Normal judgment and thought content.   Urinalysis:      Assessment and Plan:  Pregnancy: G7P5106 at 10815w3d  1. Encounter for supervision of normal pregnancy, antepartum, unspecified gravidity  2. Encounter for immunization Rx: - Tdap vaccine greater than or equal to 7yo IM  Preterm labor symptoms and general obstetric precautions including but not limited to vaginal bleeding, contractions, leaking of fluid and fetal movement were reviewed in detail with the patient. Please refer to After Visit Summary for other counseling recommendations.  Return in about 2 weeks (around 06/20/2017) for ROB.   Brock BadHarper,  Estefania Kamiya A, MD

## 2017-06-20 ENCOUNTER — Encounter: Payer: Medicaid Other | Admitting: Obstetrics

## 2017-07-21 ENCOUNTER — Other Ambulatory Visit: Payer: Self-pay

## 2017-07-21 ENCOUNTER — Inpatient Hospital Stay (HOSPITAL_COMMUNITY): Payer: Medicaid Other | Admitting: Anesthesiology

## 2017-07-21 ENCOUNTER — Inpatient Hospital Stay (HOSPITAL_COMMUNITY)
Admission: AD | Admit: 2017-07-21 | Discharge: 2017-07-23 | DRG: 798 | Disposition: A | Discharge status: Home / Self Care | Payer: Medicaid Other | Source: Ambulatory Visit | Attending: Obstetrics and Gynecology | Admitting: Obstetrics and Gynecology

## 2017-07-21 ENCOUNTER — Encounter (HOSPITAL_COMMUNITY): Payer: Self-pay

## 2017-07-21 DIAGNOSIS — O42919 Preterm premature rupture of membranes, unspecified as to length of time between rupture and onset of labor, unspecified trimester: Secondary | ICD-10-CM | POA: Diagnosis present

## 2017-07-21 DIAGNOSIS — Z3A36 36 weeks gestation of pregnancy: Secondary | ICD-10-CM

## 2017-07-21 DIAGNOSIS — O42913 Preterm premature rupture of membranes, unspecified as to length of time between rupture and onset of labor, third trimester: Principal | ICD-10-CM | POA: Diagnosis present

## 2017-07-21 DIAGNOSIS — O42013 Preterm premature rupture of membranes, onset of labor within 24 hours of rupture, third trimester: Secondary | ICD-10-CM | POA: Diagnosis not present

## 2017-07-21 DIAGNOSIS — Z302 Encounter for sterilization: Secondary | ICD-10-CM | POA: Diagnosis not present

## 2017-07-21 DIAGNOSIS — Z87891 Personal history of nicotine dependence: Secondary | ICD-10-CM

## 2017-07-21 LAB — CBC
HEMATOCRIT: 36.2 % (ref 36.0–46.0)
HEMOGLOBIN: 12.3 g/dL (ref 12.0–15.0)
MCH: 30.2 pg (ref 26.0–34.0)
MCHC: 34 g/dL (ref 30.0–36.0)
MCV: 88.9 fL (ref 78.0–100.0)
Platelets: 269 10*3/uL (ref 150–400)
RBC: 4.07 MIL/uL (ref 3.87–5.11)
RDW: 12.8 % (ref 11.5–15.5)
WBC: 9 10*3/uL (ref 4.0–10.5)

## 2017-07-21 LAB — TYPE AND SCREEN
ABO/RH(D): B POS
ANTIBODY SCREEN: NEGATIVE

## 2017-07-21 LAB — GROUP B STREP BY PCR: Group B strep by PCR: POSITIVE — AB

## 2017-07-21 LAB — GLUCOSE, CAPILLARY: GLUCOSE-CAPILLARY: 74 mg/dL (ref 65–99)

## 2017-07-21 LAB — POCT FERN TEST: POCT FERN TEST: POSITIVE

## 2017-07-21 LAB — ABO/RH: ABO/RH(D): B POS

## 2017-07-21 MED ORDER — PHENYLEPHRINE 40 MCG/ML (10ML) SYRINGE FOR IV PUSH (FOR BLOOD PRESSURE SUPPORT)
80.0000 ug | PREFILLED_SYRINGE | INTRAVENOUS | Status: DC | PRN
  Filled 2017-07-21: qty 5

## 2017-07-21 MED ORDER — FENTANYL 2.5 MCG/ML BUPIVACAINE 1/10 % EPIDURAL INFUSION (WH - ANES)
14.0000 mL/h | INTRAMUSCULAR | Status: DC | PRN
  Administered 2017-07-21: 14 mL/h via EPIDURAL
  Administered 2017-07-21: 12 mL/h via EPIDURAL

## 2017-07-21 MED ORDER — LACTATED RINGERS IV SOLN: 500.0000 mL | INTRAVENOUS | Status: DC | PRN

## 2017-07-21 MED ORDER — PENICILLIN G POTASSIUM 5000000 UNITS IJ SOLR
5.0000 10*6.[IU] | Freq: Once | INTRAMUSCULAR | Status: DC
  Filled 2017-07-21: qty 5

## 2017-07-21 MED ORDER — DIPHENHYDRAMINE HCL 50 MG/ML IJ SOLN: 12.5000 mg | INTRAMUSCULAR | Status: DC | PRN

## 2017-07-21 MED ORDER — PENICILLIN G POT IN DEXTROSE 60000 UNIT/ML IV SOLN
3.0000 10*6.[IU] | INTRAVENOUS | Status: DC
  Administered 2017-07-21: 3 10*6.[IU] via INTRAVENOUS
  Filled 2017-07-21 (×5): qty 50

## 2017-07-21 MED ORDER — BETAMETHASONE SOD PHOS & ACET 6 (3-3) MG/ML IJ SUSP
12.0000 mg | INTRAMUSCULAR | Status: DC
  Administered 2017-07-21: 12 mg via INTRAMUSCULAR
  Filled 2017-07-21: qty 2

## 2017-07-21 MED ORDER — EPHEDRINE 5 MG/ML INJ
10.0000 mg | INTRAVENOUS | Status: DC | PRN
  Filled 2017-07-21: qty 2

## 2017-07-21 MED ORDER — LACTATED RINGERS IV SOLN: 500.0000 mL | Freq: Once | INTRAVENOUS | Status: DC

## 2017-07-21 MED ORDER — ACETAMINOPHEN 325 MG PO TABS: 650.0000 mg | ORAL_TABLET | ORAL | Status: DC | PRN

## 2017-07-21 MED ORDER — LIDOCAINE HCL (PF) 1 % IJ SOLN
30.0000 mL | INTRAMUSCULAR | Status: DC | PRN
  Filled 2017-07-21: qty 30

## 2017-07-21 MED ORDER — LACTATED RINGERS IV SOLN
500.0000 mL | Freq: Once | INTRAVENOUS | Status: AC
  Administered 2017-07-21: 500 mL via INTRAVENOUS

## 2017-07-21 MED ORDER — OXYTOCIN 40 UNITS IN LACTATED RINGERS INFUSION - SIMPLE MED: 2.5000 [IU]/h | INTRAVENOUS | Status: DC

## 2017-07-21 MED ORDER — OXYTOCIN 40 UNITS IN LACTATED RINGERS INFUSION - SIMPLE MED
1.0000 m[IU]/min | INTRAVENOUS | Status: DC
  Administered 2017-07-21: 2 m[IU]/min via INTRAVENOUS
  Filled 2017-07-21: qty 1000

## 2017-07-21 MED ORDER — LACTATED RINGERS IV SOLN
INTRAVENOUS | Status: DC
  Administered 2017-07-21: 15:00:00 via INTRAVENOUS

## 2017-07-21 MED ORDER — PENICILLIN G POTASSIUM 5000000 UNITS IJ SOLR
3.0000 10*6.[IU] | INTRAMUSCULAR | Status: DC
  Filled 2017-07-21 (×6): qty 5

## 2017-07-21 MED ORDER — ONDANSETRON HCL 4 MG/2ML IJ SOLN: 4.0000 mg | Freq: Four times a day (QID) | INTRAMUSCULAR | Status: DC | PRN

## 2017-07-21 MED ORDER — PENICILLIN G POTASSIUM 5000000 UNITS IJ SOLR
5.0000 10*6.[IU] | Freq: Once | INTRAVENOUS | Status: AC
  Administered 2017-07-21: 5 10*6.[IU] via INTRAVENOUS
  Filled 2017-07-21: qty 5

## 2017-07-21 MED ORDER — LIDOCAINE HCL (PF) 1 % IJ SOLN
INTRAMUSCULAR | Status: DC | PRN
  Administered 2017-07-21: 2 mL via EPIDURAL
  Administered 2017-07-21: 5 mL via EPIDURAL
  Administered 2017-07-21: 3 mL via EPIDURAL

## 2017-07-21 MED ORDER — OXYTOCIN BOLUS FROM INFUSION
500.0000 mL | Freq: Once | INTRAVENOUS | Status: AC
  Administered 2017-07-21: 500 mL via INTRAVENOUS

## 2017-07-21 MED ORDER — TERBUTALINE SULFATE 1 MG/ML IJ SOLN
0.2500 mg | Freq: Once | INTRAMUSCULAR | Status: DC | PRN
  Filled 2017-07-21: qty 1

## 2017-07-21 MED ORDER — PHENYLEPHRINE 40 MCG/ML (10ML) SYRINGE FOR IV PUSH (FOR BLOOD PRESSURE SUPPORT)
PREFILLED_SYRINGE | INTRAVENOUS | Status: DC
Start: 2017-07-21 — End: 2017-07-21
  Filled 2017-07-21: qty 20

## 2017-07-21 MED ORDER — SOD CITRATE-CITRIC ACID 500-334 MG/5ML PO SOLN: 30.0000 mL | ORAL | Status: DC | PRN

## 2017-07-21 MED ORDER — FENTANYL 2.5 MCG/ML BUPIVACAINE 1/10 % EPIDURAL INFUSION (WH - ANES)
INTRAMUSCULAR | Status: AC
  Filled 2017-07-21: qty 100

## 2017-07-21 MED ORDER — OXYCODONE-ACETAMINOPHEN 5-325 MG PO TABS
2.0000 | ORAL_TABLET | ORAL | Status: DC | PRN
Start: 2017-07-21 — End: 2017-07-22

## 2017-07-21 MED ORDER — IBUPROFEN 600 MG PO TABS
600.0000 mg | ORAL_TABLET | Freq: Four times a day (QID) | ORAL | Status: DC
  Administered 2017-07-22 – 2017-07-23 (×4): 600 mg via ORAL
  Filled 2017-07-21 (×5): qty 1

## 2017-07-21 MED ORDER — OXYCODONE-ACETAMINOPHEN 5-325 MG PO TABS: 1.0000 | ORAL_TABLET | ORAL | Status: DC | PRN

## 2017-07-21 NOTE — Progress Notes (Signed)
Labor Progress Note Shelby Nichols is a 26 y.o. [phone] at 10929w5d presented for PPROM  S:  Feeling some ctx, declines pain meds or epidural.  O:  BP 99/65   Pulse 89   Temp 98 F (36.7 C) (Oral)   Resp 20   Ht 5' (1.524 m)   Wt 186 lb 12.8 oz (84.7 kg)   LMP 11/30/2016   SpO2 100%   BMI 36.48 kg/m  EFM: baseline 135 bpm/ mod variability/ + accels/ no decels  Toco: 2-5 SVE: Dilation: 3.5 Effacement (%): 60 Cervical Position: Posterior Station: -2 Presentation: Vertex(confirmed by U/S) Exam by:: Shelby Nichols CNM   A/P: 26 y.o. [id number] 3529w5d  1. Labor: latent 2. FWB: Cat I 3. Pain: analgesia/anesthesia  Start Pitocin augmentation. Lite labor meal x1. Anticipate SVD.  Shelby Nichols, CNM 5:57 PM

## 2017-07-21 NOTE — Anesthesia Preprocedure Evaluation (Signed)
Anesthesia Evaluation  Patient identified by MRN, date of birth, ID band Patient awake    Reviewed: Allergy & Precautions, NPO status , Patient's Chart, lab work & pertinent test results  Airway Mallampati: II  TM Distance: >3 FB Neck ROM: Full    Dental  (+) Teeth Intact, Dental Advisory Given   Pulmonary former smoker,    Pulmonary exam normal breath sounds clear to auscultation       Cardiovascular Exercise Tolerance: Good negative cardio ROS Normal cardiovascular exam Rhythm:Regular Rate:Normal     Neuro/Psych negative neurological ROS     GI/Hepatic negative GI ROS, Neg liver ROS,   Endo/Other  negative endocrine ROS  Renal/GU negative Renal ROS     Musculoskeletal negative musculoskeletal ROS (+)   Abdominal   Peds  Hematology negative hematology ROS (+) Plt 269k   Anesthesia Other Findings Day of surgery medications reviewed with the patient.  Reproductive/Obstetrics (+) Pregnancy                             Anesthesia Physical Anesthesia Plan  ASA: II  Anesthesia Plan: Epidural   Post-op Pain Management:    Induction:   PONV Risk Score and Plan: 2 and Treatment may vary due to age or medical condition  Airway Management Planned:   Additional Equipment:   Intra-op Plan:   Post-operative Plan:   Informed Consent: I have reviewed the patients History and Physical, chart, labs and discussed the procedure including the risks, benefits and alternatives for the proposed anesthesia with the patient or authorized representative who has indicated his/her understanding and acceptance.   Dental advisory given  Plan Discussed with:   Anesthesia Plan Comments: (Patient identified. Risks/Benefits/Options discussed with patient including but not limited to bleeding, infection, nerve damage, paralysis, failed block, incomplete pain control, headache, blood pressure changes,  nausea, vomiting, reactions to medication both or allergic, itching and postpartum back pain. Confirmed with bedside nurse the patient's most recent platelet count. Confirmed with patient that they are not currently taking any anticoagulation, have any bleeding history or any family history of bleeding disorders. Patient expressed understanding and wished to proceed. All questions were answered. )        Anesthesia Quick Evaluation

## 2017-07-21 NOTE — MAU Note (Signed)
Notified M. Bhambri CNM GBS positive.

## 2017-07-21 NOTE — H&P (Signed)
OBSTETRIC ADMISSION HISTORY AND PHYSICAL  Shelby Nichols is a 26 y.o. female [phone] with IUP at 7752w5d presenting for ROM on 07/20/17 @11am . She reports +FMs, No LOF, no VB, no blurry vision, headaches or peripheral edema, and RUQ pain.  She plans on bottle feeding. She request BTL for birth control and papers signed.  Dating: by 17 wks US  --->  Estimated Date of Delivery: 08/13/17  Sono:    @[redacted]w[redacted]d , CWD, normal anatomy, ceph presentation, 1394g, 55% EFW 3'1   Prenatal History/Complications: Limited PNC (2 visits)- d/t transportation issues  Past Medical History: Past Medical History:  Diagnosis Date  . Chlamydia   . Trichomonas 04/25/2011    Past Surgical History: Past Surgical History:  Procedure Laterality Date  . NO PAST SURGERIES    . VAGINAL DELIVERY      Obstetrical History: OB History    Gravida Para Term Preterm AB Living   7 6 5 1  0 6   SAB TAB Ectopic Multiple Live Births   0 0 0 0 6      Social History: Social History   Socioeconomic History  . Marital status: Single    Spouse name: None  . Number of children: 4  . Years of education: 10  . Highest education level: None  Social Needs  . Financial resource strain: None  . Food insecurity - worry: None  . Food insecurity - inability: None  . Transportation needs - medical: None  . Transportation needs - non-medical: None  Occupational History  . Occupation: Unemployed  Tobacco Use  . Smoking status: Former Smoker    Packs/day: 0.10    Years: 3.00    Pack years: 0.30    Types: Cigarettes  . Smokeless tobacco: Never Used  Substance and Sexual Activity  . Alcohol use: Yes    Comment: social, not while pregnant  . Drug use: No  . Sexual activity: Yes    Comment: Last intercourse 2 days ago  Other Topics Concern  . None  Social History Narrative  . None    Family History: Family History  Problem Relation Age of Onset  . Cancer Neg Hx   . Diabetes Neg Hx   . Hypertension Neg Hx      Allergies: No Known Allergies  Medications Prior to Admission  Medication Sig Dispense Refill Last Dose  . acetaminophen (TYLENOL) 500 MG tablet Take 1,000 mg by mouth every 6 (six) hours as needed for moderate pain or headache.   Past Week at Unknown time  . Prenat-Fe Poly-Methfol-FA-DHA (VITAFOL ULTRA) 29-0.6-0.4-200 MG CAPS Take 1 tablet by mouth daily. 30 capsule 12 Past Week at Unknown time  . Elastic Bandages & Supports (COMFORT FIT MATERNITY SUPP SM) MISC 1 application by Does not apply route daily. 1 each 0 Taking     Review of Systems   All systems reviewed and negative except as stated in HPI  Blood pressure 108/65, pulse 81, temperature 98.5 F (36.9 C), temperature source Oral, resp. rate 17, weight 186 lb 8 oz (84.6 kg), last menstrual period 11/30/2016, SpO2 100 %, unknown if currently breastfeeding. General appearance: alert, cooperative and no distress Lungs: clear to auscultation bilaterally Heart: regular rate and rhythm Abdomen: soft, non-tender; bowel sounds normal Pelvic: 3/50, vtx per RN exam Extremities: Homans sign is negative, no sign of DVT  Fetal monitoringBaseline: 125 bpm, Variability: Good {> 6 bpm), Accelerations: Reactive and Decelerations: Absent Uterine activityFrequency: q3-7 Dilation: 3 Effacement (%): Thick Station: -3 Exam by::  n druebbisch rn   Prenatal labs: ABO, Rh: B/Positive/-- (10/29 1643) Antibody: Positive, See Final Results (10/29 1643) Rubella: 1.20 (10/29 1643) RPR: Non Reactive (10/29 1643)  HBsAg: Negative (10/29 1643)  HIV: Non Reactive (10/29 1643)  GBS:  unknown 1 hr Glucola not done Genetic screening  declined Anatomy US nml  Prenatal Transfer Tool  Maternal Diabetes: no tested d/t limited care Genetic Screening: Declined Maternal Ultrasounds/Referrals: Normal Fetal Ultrasounds or other Referrals:  None Maternal Substance Abuse:  No Significant Maternal Medications:  None Significant Maternal Lab Results:  None  Results for orders placed or performed during the hospital encounter of 07/21/17 (from the past 24 hour(s))  POCT fern test   Collection Time: 07/21/17  2:09 PM  Result Value Ref Range   POCT Fern Test Positive = ruptured amniotic membanes     Patient Active Problem List   Diagnosis Date Noted  . Supervision of normal pregnancy, antepartum 05/18/2017  . NVD (normal vaginal delivery) 10/03/2015    Assessment: Shelby Nichols is a 26 y.o. G7P5106 at 6833w5d here for SROM  1. Labor: latent 2. FWB: Cat I 3. Pain: analgesia/anesthesia prn 4. GBS: unknown   Plan: 1. Admit to BS 2. Pitocin  3. BMZ 4. PCN 5. Anticipate SVD  Donette LarryMelanie Debany Vantol, CNM  07/21/2017, 2:10 PM

## 2017-07-21 NOTE — MAU Note (Signed)
Yesterday was laying down around 1100, heard a pop, got up and there was a whole bunch of water leaking- continues to come. irreg pains, no bleeding.

## 2017-07-21 NOTE — Anesthesia Procedure Notes (Signed)
Epidural Patient location during procedure: OB Start time: 07/21/2017 7:25 PM End time: 07/21/2017 7:30 PM  Staffing Anesthesiologist: Cecile Hearingurk, Stephen Edward, MD Performed: anesthesiologist   Preanesthetic Checklist Completed: patient identified, pre-op evaluation, timeout performed, IV checked, risks and benefits discussed and monitors and equipment checked  Epidural Patient position: sitting Prep: DuraPrep Patient monitoring: blood pressure and continuous pulse ox Approach: midline Location: L3-L4 Injection technique: LOR air  Needle:  Needle type: Tuohy  Needle gauge: 17 G Needle length: 9 cm Needle insertion depth: 7 cm Catheter size: 19 Gauge Catheter at skin depth: 12 cm Test dose: negative and Other (1% Lidocaine)  Additional Notes Patient identified.  Risk benefits discussed including failed block, incomplete pain control, headache, nerve damage, paralysis, blood pressure changes, nausea, vomiting, reactions to medication both toxic or allergic, and postpartum back pain.  Patient expressed understanding and wished to proceed.  All questions were answered.  Sterile technique used throughout procedure and epidural site dressed with sterile barrier dressing. No paresthesia or other complications noted. The patient did not experience any signs of intravascular injection such as tinnitus or metallic taste in mouth nor signs of intrathecal spread such as rapid motor block. Please see nursing notes for vital signs. Reason for block:procedure for pain

## 2017-07-22 ENCOUNTER — Encounter (HOSPITAL_COMMUNITY)
Admission: AD | Disposition: A | Discharge status: Home / Self Care | Payer: Self-pay | Source: Ambulatory Visit | Attending: Obstetrics and Gynecology

## 2017-07-22 ENCOUNTER — Encounter (HOSPITAL_COMMUNITY): Payer: Self-pay

## 2017-07-22 ENCOUNTER — Inpatient Hospital Stay (HOSPITAL_COMMUNITY): Payer: Medicaid Other | Admitting: Anesthesiology

## 2017-07-22 DIAGNOSIS — Z302 Encounter for sterilization: Secondary | ICD-10-CM

## 2017-07-22 HISTORY — PX: TUBAL LIGATION: SHX77

## 2017-07-22 LAB — HIV ANTIBODY (ROUTINE TESTING W REFLEX): HIV Screen 4th Generation wRfx: NONREACTIVE

## 2017-07-22 LAB — RPR: RPR Ser Ql: NONREACTIVE

## 2017-07-22 SURGERY — LIGATION, FALLOPIAN TUBE, POSTPARTUM
Anesthesia: Epidural | Site: Abdomen | Laterality: Bilateral | Wound class: Clean

## 2017-07-22 MED ORDER — OXYCODONE HCL 5 MG/5ML PO SOLN: 5.0000 mg | Freq: Once | ORAL | Status: DC | PRN

## 2017-07-22 MED ORDER — ONDANSETRON HCL 4 MG PO TABS: 4.0000 mg | ORAL_TABLET | ORAL | Status: DC | PRN

## 2017-07-22 MED ORDER — HYDROMORPHONE HCL 1 MG/ML IJ SOLN
INTRAMUSCULAR | Status: AC
  Filled 2017-07-22: qty 0.5

## 2017-07-22 MED ORDER — SENNOSIDES-DOCUSATE SODIUM 8.6-50 MG PO TABS
2.0000 | ORAL_TABLET | ORAL | Status: DC
  Administered 2017-07-22 – 2017-07-23 (×2): 2 via ORAL
  Filled 2017-07-22 (×2): qty 2

## 2017-07-22 MED ORDER — LACTATED RINGERS IV SOLN: INTRAVENOUS | Status: DC

## 2017-07-22 MED ORDER — WITCH HAZEL-GLYCERIN EX PADS: 1.0000 "application " | MEDICATED_PAD | CUTANEOUS | Status: DC | PRN

## 2017-07-22 MED ORDER — KETOROLAC TROMETHAMINE 30 MG/ML IJ SOLN
INTRAMUSCULAR | Status: AC
  Filled 2017-07-22: qty 1

## 2017-07-22 MED ORDER — ONDANSETRON HCL 4 MG/2ML IJ SOLN
INTRAMUSCULAR | Status: AC
  Filled 2017-07-22: qty 2

## 2017-07-22 MED ORDER — OXYCODONE HCL 5 MG PO TABS: 5.0000 mg | ORAL_TABLET | Freq: Once | ORAL | Status: DC | PRN

## 2017-07-22 MED ORDER — ONDANSETRON HCL 4 MG/2ML IJ SOLN
INTRAMUSCULAR | Status: DC | PRN
  Administered 2017-07-22: 4 mg via INTRAVENOUS

## 2017-07-22 MED ORDER — MIDAZOLAM HCL 5 MG/5ML IJ SOLN
INTRAMUSCULAR | Status: DC | PRN
  Administered 2017-07-22 (×3): 1 mg via INTRAVENOUS

## 2017-07-22 MED ORDER — FENTANYL CITRATE (PF) 100 MCG/2ML IJ SOLN
INTRAMUSCULAR | Status: DC | PRN
  Administered 2017-07-22 (×3): 50 ug via INTRAVENOUS

## 2017-07-22 MED ORDER — BUPIVACAINE HCL 0.5 % IJ SOLN
INTRAMUSCULAR | Status: DC | PRN
  Administered 2017-07-22: 30 mL

## 2017-07-22 MED ORDER — PROMETHAZINE HCL 25 MG/ML IJ SOLN: 6.2500 mg | INTRAMUSCULAR | Status: DC | PRN

## 2017-07-22 MED ORDER — SOD CITRATE-CITRIC ACID 500-334 MG/5ML PO SOLN: 30.0000 mL | ORAL | Status: DC

## 2017-07-22 MED ORDER — ACETAMINOPHEN 325 MG PO TABS
650.0000 mg | ORAL_TABLET | ORAL | Status: DC | PRN
  Administered 2017-07-22 (×2): 650 mg via ORAL
  Filled 2017-07-22 (×2): qty 2

## 2017-07-22 MED ORDER — DIBUCAINE 1 % RE OINT: 1.0000 "application " | TOPICAL_OINTMENT | RECTAL | Status: DC | PRN

## 2017-07-22 MED ORDER — HYDROMORPHONE HCL 1 MG/ML IJ SOLN
0.2500 mg | INTRAMUSCULAR | Status: DC | PRN
  Administered 2017-07-22: 0.5 mg via INTRAVENOUS

## 2017-07-22 MED ORDER — TETANUS-DIPHTH-ACELL PERTUSSIS 5-2.5-18.5 LF-MCG/0.5 IM SUSP
0.5000 mL | Freq: Once | INTRAMUSCULAR | Status: DC
Start: 2017-07-22 — End: 2017-07-23

## 2017-07-22 MED ORDER — PRENATAL MULTIVITAMIN CH
1.0000 | ORAL_TABLET | Freq: Every day | ORAL | Status: DC
  Administered 2017-07-23: 1 via ORAL
  Filled 2017-07-22: qty 1

## 2017-07-22 MED ORDER — MIDAZOLAM HCL 2 MG/2ML IJ SOLN
INTRAMUSCULAR | Status: AC
  Filled 2017-07-22: qty 2

## 2017-07-22 MED ORDER — FENTANYL CITRATE (PF) 100 MCG/2ML IJ SOLN
INTRAMUSCULAR | Status: AC
  Filled 2017-07-22: qty 2

## 2017-07-22 MED ORDER — KETOROLAC TROMETHAMINE 30 MG/ML IJ SOLN
INTRAMUSCULAR | Status: DC | PRN
  Administered 2017-07-22: 30 mg via INTRAVENOUS

## 2017-07-22 MED ORDER — ONDANSETRON HCL 4 MG/2ML IJ SOLN: 4.0000 mg | INTRAMUSCULAR | Status: DC | PRN

## 2017-07-22 MED ORDER — NALBUPHINE HCL 10 MG/ML IJ SOLN
10.0000 mg | Freq: Once | INTRAMUSCULAR | Status: AC
  Administered 2017-07-22: 10 mg via INTRAMUSCULAR
  Filled 2017-07-22: qty 1

## 2017-07-22 MED ORDER — OXYCODONE HCL 5 MG PO TABS
5.0000 mg | ORAL_TABLET | ORAL | Status: DC | PRN
  Administered 2017-07-22 – 2017-07-23 (×4): 5 mg via ORAL
  Filled 2017-07-22 (×3): qty 1

## 2017-07-22 MED ORDER — BUPIVACAINE HCL (PF) 0.5 % IJ SOLN
INTRAMUSCULAR | Status: AC
  Filled 2017-07-22: qty 30

## 2017-07-22 MED ORDER — METOCLOPRAMIDE HCL 10 MG PO TABS
10.0000 mg | ORAL_TABLET | Freq: Once | ORAL | Status: AC
  Administered 2017-07-22: 10 mg via ORAL
  Filled 2017-07-22: qty 1

## 2017-07-22 MED ORDER — OXYCODONE HCL 5 MG PO TABS
5.0000 mg | ORAL_TABLET | ORAL | Status: DC | PRN
  Filled 2017-07-22 (×2): qty 1

## 2017-07-22 MED ORDER — ZOLPIDEM TARTRATE 5 MG PO TABS: 5.0000 mg | ORAL_TABLET | Freq: Every evening | ORAL | Status: DC | PRN

## 2017-07-22 MED ORDER — LACTATED RINGERS IV SOLN
INTRAVENOUS | Status: DC | PRN
  Administered 2017-07-22: 13:00:00 via INTRAVENOUS

## 2017-07-22 MED ORDER — LIDOCAINE-EPINEPHRINE (PF) 2 %-1:200000 IJ SOLN
INTRAMUSCULAR | Status: AC
  Filled 2017-07-22: qty 20

## 2017-07-22 MED ORDER — BENZOCAINE-MENTHOL 20-0.5 % EX AERO: 1.0000 "application " | INHALATION_SPRAY | CUTANEOUS | Status: DC | PRN

## 2017-07-22 MED ORDER — SODIUM CHLORIDE 0.9 % IR SOLN
Status: DC | PRN
  Administered 2017-07-22: 1000 mL

## 2017-07-22 MED ORDER — SODIUM BICARBONATE 8.4 % IV SOLN
INTRAVENOUS | Status: DC | PRN
  Administered 2017-07-22: 5 mL via EPIDURAL
  Administered 2017-07-22: 2 mL via EPIDURAL
  Administered 2017-07-22: 5 mL via EPIDURAL
  Administered 2017-07-22: 2 mL via EPIDURAL

## 2017-07-22 MED ORDER — SIMETHICONE 80 MG PO CHEW: 80.0000 mg | CHEWABLE_TABLET | ORAL | Status: DC | PRN

## 2017-07-22 MED ORDER — DIPHENHYDRAMINE HCL 25 MG PO CAPS: 25.0000 mg | ORAL_CAPSULE | Freq: Four times a day (QID) | ORAL | Status: DC | PRN

## 2017-07-22 MED ORDER — FAMOTIDINE 20 MG PO TABS
40.0000 mg | ORAL_TABLET | Freq: Once | ORAL | Status: AC
  Administered 2017-07-22: 40 mg via ORAL
  Filled 2017-07-22 (×2): qty 2

## 2017-07-22 MED ORDER — COCONUT OIL OIL: 1.0000 "application " | TOPICAL_OIL | Status: DC | PRN

## 2017-07-22 SURGICAL SUPPLY — 23 items
APL SKNCLS STERI-STRIP NONHPOA (GAUZE/BANDAGES/DRESSINGS) ×1
BENZOIN TINCTURE PRP APPL 2/3 (GAUZE/BANDAGES/DRESSINGS) ×3 IMPLANT
CLIP FILSHIE TUBAL LIGA STRL (Clip) ×2 IMPLANT
CLOSURE WOUND 1/2 X4 (GAUZE/BANDAGES/DRESSINGS) ×1
CLOTH BEACON ORANGE TIMEOUT ST (SAFETY) ×3 IMPLANT
DRSG OPSITE POSTOP 3X4 (GAUZE/BANDAGES/DRESSINGS) ×3 IMPLANT
DURAPREP 26ML APPLICATOR (WOUND CARE) ×3 IMPLANT
GLOVE BIOGEL PI IND STRL 7.0 (GLOVE) ×3 IMPLANT
GLOVE BIOGEL PI INDICATOR 7.0 (GLOVE) ×6
GLOVE ECLIPSE 7.0 STRL STRAW (GLOVE) ×3 IMPLANT
GOWN STRL REUS W/TWL LRG LVL3 (GOWN DISPOSABLE) ×6 IMPLANT
GOWN STRL REUS W/TWL XL LVL3 (GOWN DISPOSABLE) ×3 IMPLANT
NEEDLE HYPO 22GX1.5 SAFETY (NEEDLE) ×3 IMPLANT
NS IRRIG 1000ML POUR BTL (IV SOLUTION) ×3 IMPLANT
PACK ABDOMINAL MINOR (CUSTOM PROCEDURE TRAY) ×3 IMPLANT
PROTECTOR NERVE ULNAR (MISCELLANEOUS) ×3 IMPLANT
STRIP CLOSURE SKIN 1/2X4 (GAUZE/BANDAGES/DRESSINGS) ×2 IMPLANT
SUT VIC AB 0 CT1 27 (SUTURE) ×3
SUT VIC AB 0 CT1 27XBRD ANBCTR (SUTURE) ×1 IMPLANT
SUT VIC AB 4-0 PS2 27 (SUTURE) ×3 IMPLANT
SYR CONTROL 10ML LL (SYRINGE) ×3 IMPLANT
TOWEL OR 17X24 6PK STRL BLUE (TOWEL DISPOSABLE) ×6 IMPLANT
TRAY FOLEY CATH 16FR SILVER (SET/KITS/TRAYS/PACK) ×3 IMPLANT

## 2017-07-22 NOTE — Progress Notes (Signed)
Patient desires permanent sterilization.  Other reversible forms of contraception were discussed with patient; she declines all other modalities. Risks of procedure discussed with patient including but not limited to: risk of regret, permanence of method, bleeding, infection, injury to surrounding organs and need for additional procedures.  Failure risk of 1-2 % with increased risk of ectopic gestation if pregnancy occurs was also discussed with patient.  Patient verbalized understanding of these risks and wants to proceed with sterilization.  Written informed consent obtained.  To OR when ready.  Julie P. Degele, MD OB Fellow  

## 2017-07-22 NOTE — Anesthesia Postprocedure Evaluation (Signed)
Anesthesia Post Note  Patient: Shelby Nichols  Procedure(s) Performed: AN AD HOC LABOR EPIDURAL     Patient location during evaluation: Mother Baby Anesthesia Type: Epidural Level of consciousness: awake and alert, oriented and patient cooperative Pain management: pain level controlled Vital Signs Assessment: post-procedure vital signs reviewed and stable Respiratory status: spontaneous breathing Cardiovascular status: stable Postop Assessment: no headache, epidural receding, patient able to bend at knees and no signs of nausea or vomiting Anesthetic complications: no Comments: Pain score 0.    Last Vitals:  Vitals:   07/22/17 0031 07/22/17 0506  BP: (!) 107/54 (!) 102/53  Pulse: 63 65  Resp: 14 16  Temp: 36.9 C 36.7 C  SpO2: 100% 98%    Last Pain:  Vitals:   07/22/17 0506  TempSrc: Oral  PainSc:    Pain Goal:                 [facility] CenterWRINKLE,Travanti Mcmanus

## 2017-07-22 NOTE — Clinical Social Work Note (Signed)
LCSW spoke with CPS who reported that MOB can be discharged home with her infant when ready for discharge.  CPS will follow up with MOB at home after discharge.

## 2017-07-22 NOTE — Anesthesia Postprocedure Evaluation (Signed)
Anesthesia Post Note  Patient: Shelby Nichols  Procedure(s) Performed: AN AD HOC LABOR EPIDURAL     Patient location during evaluation: Mother Baby Anesthesia Type: Epidural Level of consciousness: awake Pain management: pain level controlled Vital Signs Assessment: post-procedure vital signs reviewed and stable Respiratory status: spontaneous breathing Cardiovascular status: stable Postop Assessment: no headache, no backache, epidural receding, patient able to bend at knees, no apparent nausea or vomiting and adequate PO intake Anesthetic complications: no    Last Vitals:  Vitals:   07/22/17 1445 07/22/17 1505  BP: 102/64 105/60  Pulse: (!) 58 69  Resp: 17 18  Temp:    SpO2: 98% 100%    Last Pain:  Vitals:   07/22/17 1600  TempSrc:   PainSc: 10-Worst pain ever   Pain Goal:                 Cheyne Boulden

## 2017-07-22 NOTE — Progress Notes (Signed)
Patient ID: Shelby Nichols, female   DOB: [birth year], 26 y.o.   MRN: [id number] Patient desires surgical management with postpartum tubal ligation.  The risks of surgery were discussed in detail with the patient including but not limited to: bleeding which may require transfusion or reoperation; infection which may require prolonged hospitalization or re-hospitalization and antibiotic therapy; injury to bowel, bladder, ureters and major vessels or other surrounding organs; need for additional procedures including laparotomy; thromboembolic phenomenon, incisional problems and other postoperative or anesthesia complications. I discussed a 09/24/998 risk of failure. Patient was told that the likelihood that her condition and symptoms will be treated effectively with this surgical management was very high; the postoperative expectations were also discussed in detail. The patient also understands the alternative treatment options which were discussed in full. All questions were answered.  Pt is on call to the OR when ready.  [name] L. Harraway-Smith, M.D., Evern CoreFACOG

## 2017-07-22 NOTE — Anesthesia Preprocedure Evaluation (Addendum)
Anesthesia Evaluation  Patient identified by MRN, date of birth, ID band Patient awake    Reviewed: Allergy & Precautions, NPO status , Patient's Chart, lab work & pertinent test results  Airway Mallampati: I  TM Distance: >3 FB Neck ROM: Full    Dental no notable dental hx.    Pulmonary former smoker,    Pulmonary exam normal breath sounds clear to auscultation       Cardiovascular negative cardio ROS Normal cardiovascular exam Rhythm:Regular Rate:Normal     Neuro/Psych negative neurological ROS  negative psych ROS   GI/Hepatic negative GI ROS, Neg liver ROS,   Endo/Other  negative endocrine ROS  Renal/GU negative Renal ROS     Musculoskeletal negative musculoskeletal ROS (+)   Abdominal (+) + obese,   Peds  Hematology negative hematology ROS (+)   Anesthesia Other Findings desires sterilization  Reproductive/Obstetrics                            Anesthesia Physical Anesthesia Plan  ASA: II  Anesthesia Plan: Epidural   Post-op Pain Management:    Induction: Intravenous  PONV Risk Score and Plan: 2 and Treatment may vary due to age or medical condition and Ondansetron  Airway Management Planned: Natural Airway  Additional Equipment:   Intra-op Plan:   Post-operative Plan:   Informed Consent: I have reviewed the patients History and Physical, chart, labs and discussed the procedure including the risks, benefits and alternatives for the proposed anesthesia with the patient or authorized representative who has indicated his/her understanding and acceptance.   Dental advisory given  Plan Discussed with: CRNA  Anesthesia Plan Comments:        Anesthesia Quick Evaluation

## 2017-07-22 NOTE — Brief Op Note (Signed)
07/21/2017 - 07/22/2017  1:23 PM  PATIENT:  Shelby Nichols  26 y.o. female  PRE-OPERATIVE DIAGNOSIS:  desires sterilization  POST-OPERATIVE DIAGNOSIS:  desires sterilization  PROCEDURE:  Procedure(s): POST PARTUM TUBAL LIGATION (Bilateral)  SURGEON:  Surgeon(s) and Role:    * Willodean RosenthalHarraway-Smith, Cloys Vera, MD - Primary  ANESTHESIA:   epidural  EBL:  10 mL   BLOOD ADMINISTERED:none  DRAINS: none   LOCAL MEDICATIONS USED:  MARCAINE     SPECIMEN:  No Specimen  DISPOSITION OF SPECIMEN:  N/A  COUNTS:  YES  TOURNIQUET:  * No tourniquets in log *  DICTATION: .Note written in EPIC  PLAN OF CARE: Pt has active admission order  PATIENT DISPOSITION:  PACU - hemodynamically stable.   Delay start of Pharmacological VTE agent (>24hrs) due to surgical blood loss or risk of bleeding: not applicable  Complications: none immediate  [name] L. Harraway-Smith, M.D., Evern CoreFACOG

## 2017-07-22 NOTE — Op Note (Signed)
07/21/2017 - 07/22/2017  1:23 PM  PATIENT:  Shelby Nichols  26 y.o. female  PRE-OPERATIVE DIAGNOSIS:  desires sterilization  POST-OPERATIVE DIAGNOSIS:  desires sterilization  PROCEDURE:  Procedure(s): POST PARTUM TUBAL LIGATION (Bilateral)  SURGEON:  Surgeon(s) and Role:    * Willodean RosenthalHarraway-Smith, Armour Villanueva, MD - Primary  ANESTHESIA:   epidural  EBL:  10 mL   BLOOD ADMINISTERED:none  DRAINS: none   LOCAL MEDICATIONS USED:  MARCAINE     SPECIMEN:  No Specimen  DISPOSITION OF SPECIMEN:  N/A  COUNTS:  YES  TOURNIQUET:  * No tourniquets in log *  DICTATION: .Note written in EPIC  PLAN OF CARE: Pt has active admission order  PATIENT DISPOSITION:  PACU - hemodynamically stable.   Delay start of Pharmacological VTE agent (>24hrs) due to surgical blood loss or risk of bleeding: not applicable  Complications: none immediate  INDICATIONS: 26 y.o. [id number]  with undesired fertility,status post vaginal delivery, desires permanent sterilization.  Other reversible forms of contraception were discussed with patient; she declines all other modalities. Risks of procedure discussed with patient including but not limited to: risk of regret, permanence of method, bleeding, infection, injury to surrounding organs and need for additional procedures.  Failure risk of 0.5-1% with increased risk of ectopic gestation if pregnancy occurs was also discussed with patient.     FINDINGS:  Normal uterus, tubes, and ovaries.  PROCEDURE DETAILS: The patient was taken to the operating room where her epidural anesthesia was dosed up to surgical level and found to be adequate.  She was then placed in the dorsal supine position and prepped and draped in sterile fashion.  After an adequate timeout was performed, 30cc of.5% Marcaine was injected into the pts umbilicus.  Atttention was then turned to the patient's abdomen where a small transverse skin incision was made under the umbilical fold. The incision was taken  down to the layer of fascia using the scalpel, and fascia was incised, and extended bilaterally using Mayo scissors. The peritoneum was entered in a sharp fashion. Attention was then turned to the patient's uterus, and left fallopian tube was identified and followed out to the fimbriated end.  A Filshie clip was placed on the left fallopian tube about 3 cm from the cornual attachment, with care given to incorporate the underlying mesosalpinx.  A similar process was carried out on the right side allowing for bilateral tubal sterilization.  Good hemostasis was noted overall.  The instruments were then removed from the patient's abdomen and the fascial incision was repaired with 0 Vicryl, and the skin was closed with a 4-0 Vicryl subcuticular stitch. The patient tolerated the procedure well.  Instrument, sponge, and needle counts were correct times two.  The patient was then taken to the recovery room awake and in stable condition.  Antonio Creswell L. Harraway-Smith, M.D., Evern CoreFACOG

## 2017-07-22 NOTE — Anesthesia Postprocedure Evaluation (Signed)
Anesthesia Post Note  Patient: Shelby Nichols  Procedure(s) Performed: POST PARTUM TUBAL LIGATION (Bilateral Abdomen)     Patient location during evaluation: Mother Baby Anesthesia Type: Epidural Level of consciousness: awake Pain management: pain level controlled Vital Signs Assessment: post-procedure vital signs reviewed and stable Respiratory status: spontaneous breathing Cardiovascular status: stable Postop Assessment: no headache, no backache, epidural receding, patient able to bend at knees, no apparent nausea or vomiting and adequate PO intake Anesthetic complications: no    Last Vitals:  Vitals:   07/22/17 1445 07/22/17 1505  BP: 102/64 105/60  Pulse: (!) 58 69  Resp: 17 18  Temp:    SpO2: 98% 100%    Last Pain:  Vitals:   07/22/17 1600  TempSrc:   PainSc: 10-Worst pain ever   Pain Goal:                 Raheen Capili

## 2017-07-22 NOTE — Transfer of Care (Signed)
Immediate Anesthesia Transfer of Care Note  Patient: Shelby Nichols  Procedure(s) Performed: POST PARTUM TUBAL LIGATION (Bilateral Abdomen)  Patient Location: PACU  Anesthesia Type:Epidural  Level of Consciousness: awake, alert  and oriented  Airway & Oxygen Therapy: Patient Spontanous Breathing  Post-op Assessment: Report given to RN  Post vital signs: Reviewed and stable  Last Vitals:  Vitals:   07/22/17 0506 07/22/17 1041  BP: (!) 102/53 (!) 101/53  Pulse: 65 (!) 27  Resp: 16 18  Temp: 36.7 C 36.7 C  SpO2: 98%     Last Pain:  Vitals:   07/22/17 1041  TempSrc: Oral  PainSc:          Complications: No apparent anesthesia complications

## 2017-07-22 NOTE — Clinical Social Work Maternal (Addendum)
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)   Address:  [address] [place] [address]    Phone number:  [phone] (home)     Additional phone number:   Household Members/Support Persons (HM/SP):   Household Member/Support Person 1   HM/SP Name Relationship DOB or Age  HM/SP -1 [place] significant other   HM/SP -2     HM/SP -3     HM/SP -4     HM/SP -5     HM/SP -6     HM/SP -7     HM/SP -8       Natural Supports (not living in the home): Immediate Family, Extended Family   Professional Supports:None   Employment:Unemployed   Type of Work:     Education:  High school graduate   Homebound arranged:    Financial Resources:Medicaid   Other Resources: Food Stamps , Public Housing    Cultural/Religious Considerations Which May Impact Care: none  Strengths: Home prepared for child    Psychotropic Medications:         Pediatrician:       Pediatrician List:   [place]   [place]   [place]   [place]   [place]   [place]     Pediatrician Fax Number: (does not have a peditrician at this time for newborn)  Risk Factors/Current Problems: DHHS Involvement , Substance Use    Cognitive State:     Mood/Affect: Comfortable , Calm    CSW Assessment:LCSW met with MOB and FOB at bedside to assess for services.  MOB reported that this was her 28th child stating she had a 28 year old, a 28 year old, an 28 year old, a 28 year old, a 28 year old and a 28 year  old (4 girls and 3 boys. )   MOB reported that FOB is very supportive and helpful and she also has the help of her mother and FOB's mother for additional support. MOB reported that she did not have a pediatrician yet for newborn.  MOB reported that she was not happy with her current pediatrician that had been seeing her other children.  She reported that she did not plan on returning to this pediatrician and stated that she did not want to discuss what had happened for her not to want to return. LCSW asked MOB about late prenatal care and she reported that she had lost her transportation.  LCSW provided information regarding medicaid transportation.  LCSW explained the hospital policy regarding substance exposed newborn and MOB was calm stating "I only smoked once on my birthday".  MOB denied use of any other drugs or history of CPS involvement.  LCSW called and spoke with Wes Earle of [place] CPS to report drug exposed newborn.  CPS stated that they would  call back once report is staffed with supervisor.   CSW Plan/Description: CSW Will Continue to Monitor Umbilical Cord Tissue Drug Screen Results and Make Report if Warranted    Beadie Matsunaga G Isabela Nardelli, LCSW   07/22/2017, 11:24 AM             

## 2017-07-23 MED ORDER — IBUPROFEN 600 MG PO TABS: 600.0000 mg | ORAL_TABLET | Freq: Four times a day (QID) | ORAL | 0 refills | Status: DC

## 2017-07-23 MED ORDER — OXYCODONE HCL 5 MG PO TABS: 5.0000 mg | ORAL_TABLET | ORAL | 0 refills | Status: DC | PRN

## 2017-07-23 NOTE — Discharge Summary (Signed)
OB Discharge Summary     Patient Name: Shelby Nichols DOB: 08-Mar-1991 MRN: [id number]  Date of admission: 07/21/2017 Delivering MD: Frederik PearEGELE, JULIE P   Date of discharge: 07/23/2017  Admitting diagnosis: 36wks Think water broke Intrauterine pregnancy: 1422w5d     Secondary diagnosis:  Principal Problem:   SVD (spontaneous vaginal delivery) Active Problems:   Preterm premature rupture of membranes   Encounter for sterilization  Additional problems: none     Discharge diagnosis: Term Pregnancy Delivered                                                                                                Post partum procedures:postpartum tubal ligation  Complications: None  Hospital course:  Onset of Labor With Vaginal Delivery     26 y.o. yo [id number] at 2722w5d was admitted in Latent Labor on 07/21/2017 with ROM. Patient had an uncomplicated labor course as follows:  Membrane Rupture Time/Date: 11:00 AM ,07/20/2017   Intrapartum Procedures: Episiotomy: None [1]                                         Lacerations:  None [1]  Patient had a delivery of a Viable infant. 07/21/2017  Information for the patient's newborn:  Shelby Nichols [[id number]][[id number]]  Delivery Method: Vag-Spont    [name] had an uncomplicated postpartum course.  She is ambulating, tolerating a regular diet, passing flatus, and urinating well. Patient is discharged home in stable condition on 07/23/17.   Physical exam  Vitals:   07/22/17 2052 07/22/17 2334 07/23/17 0324 07/23/17 0543  BP: 106/80 110/84 102/62 (!) 111/59  Pulse: 98 87 (!) 58 75  Resp: 17 16 18 16   Temp: 98 F (36.7 C) 98.2 F (36.8 C) 98 F (36.7 C) 98.1 F (36.7 C)  TempSrc: Oral Oral Oral Oral  SpO2: 98% 99% 96% 100%  Weight:    177 lb 12.8 oz (80.6 kg)  Height:       General: alert, cooperative and no distress Lochia: appropriate Uterine Fundus: firm Incision: N/A DVT Evaluation: No evidence of DVT seen on physical exam. Labs: Lab  Results  Component Value Date   WBC 9.0 07/21/2017   HGB 12.3 07/21/2017   HCT 36.2 07/21/2017   MCV 88.9 07/21/2017   PLT 269 07/21/2017   CMP Latest Ref Rng & Units 05/07/2012  Glucose 70 - 99 mg/dL 76  BUN 6 - 23 mg/dL 7  Creatinine 6.960.50 - 2.951.10 mg/dL 2.84(X0.48(L)  Sodium 324135 - 401145 mEq/L 136  Potassium 3.5 - 5.1 mEq/L 3.8  Chloride 96 - 112 mEq/L 101  CO2 19 - 32 mEq/L 25  Calcium 8.4 - 10.5 mg/dL 8.6  Total Protein 6.0 - 8.3 g/dL 6.2  Total Bilirubin 0.3 - 1.2 mg/dL 0.7  Alkaline Phos 39 - 117 U/L 97  AST 0 - 37 U/L 83(H)  ALT 0 - 35 U/L 69(H)    Discharge instruction: per After Visit Summary and "Baby and Me Booklet".  After  visit meds:  Allergies as of 07/23/2017   No Known Allergies     Medication List    STOP taking these medications   acetaminophen 500 MG tablet Commonly known as:  TYLENOL   COMFORT FIT MATERNITY SUPP SM Misc     TAKE these medications   ibuprofen 600 MG tablet Commonly known as:  ADVIL,MOTRIN Take 1 tablet (600 mg total) by mouth every 6 (six) hours.   oxyCODONE 5 MG immediate release tablet Commonly known as:  Oxy IR/ROXICODONE Take 1 tablet (5 mg total) by mouth every 4 (four) hours as needed for moderate pain.   VITAFOL ULTRA 29-0.6-0.4-200 MG Caps Take 1 tablet by mouth daily.       Diet: routine diet  Activity: Advance as tolerated. Pelvic rest for 6 weeks.   Outpatient follow up:6 weeks Follow up Appt: Future Appointments  Date Time Provider Department Center  08/20/2017  9:00 AM Brock BadHarper, Charles A, MD [facility] None   Follow up Visit:No Follow-up on file.  Postpartum contraception: Tubal Ligation  Newborn Data: Live born female  Birth Weight: 5 lb 14.4 oz (2676 g) APGAR: 9, 9  Newborn Delivery   Birth date/time:  07/21/2017 21:40:00 Delivery type:  Vaginal, Spontaneous     Baby Feeding: Bottle Disposition:home with mother   07/23/2017 Shelby Rosenthalarolyn Harraway-Smith, MD

## 2017-07-23 NOTE — Discharge Instructions (Signed)
Vaginal Delivery, Care After °Refer to this sheet in the next few weeks. These instructions provide you with information about caring for yourself after vaginal delivery. Your health care provider may also give you more specific instructions. Your treatment has been planned according to current medical practices, but problems sometimes occur. Call your health care provider if you have any problems or questions. °What can I expect after the procedure? °After vaginal delivery, it is common to have: °· Some bleeding from your vagina. °· Soreness in your abdomen, your vagina, and the area of skin between your vaginal opening and your anus (perineum). °· Pelvic cramps. °· Fatigue. ° °Follow these instructions at home: °Medicines °· Take over-the-counter and prescription medicines only as told by your health care provider. °· If you were prescribed an antibiotic medicine, take it as told by your health care provider. Do not stop taking the antibiotic until it is finished. °Driving ° °· Do not drive or operate heavy machinery while taking prescription pain medicine. °· Do not drive for 24 hours if you received a sedative. °Lifestyle °· Do not drink alcohol. This is especially important if you are breastfeeding or taking medicine to relieve pain. °· Do not use tobacco products, including cigarettes, chewing tobacco, or e-cigarettes. If you need help quitting, ask your health care provider. °Eating and drinking °· Drink at least 8 eight-ounce glasses of water every day unless you are told not to by your health care provider. If you choose to breastfeed your baby, you may need to drink more water than this. °· Eat high-fiber foods every day. These foods may help prevent or relieve constipation. High-fiber foods include: °? Whole grain cereals and breads. °? Brown rice. °? Beans. °? Fresh fruits and vegetables. °Activity °· Return to your normal activities as told by your health care provider. Ask your health care provider  what activities are safe for you. °· Rest as much as possible. Try to rest or take a nap when your baby is sleeping. °· Do not lift anything that is heavier than your baby or 10 lb (4.5 kg) until your health care provider says that it is safe. °· Talk with your health care provider about when you can engage in sexual activity. This may depend on your: °? Risk of infection. °? Rate of healing. °? Comfort and desire to engage in sexual activity. °Vaginal Care °· If you have an episiotomy or a vaginal tear, check the area every day for signs of infection. Check for: °? More redness, swelling, or pain. °? More fluid or blood. °? Warmth. °? Pus or a bad smell. °· Do not use tampons or douches until your health care provider says this is safe. °· Watch for any blood clots that may pass from your vagina. These may look like clumps of dark red, brown, or black discharge. °General instructions °· Keep your perineum clean and dry as told by your health care provider. °· Wear loose, comfortable clothing. °· Wipe from front to back when you use the toilet. °· Ask your health care provider if you can shower or take a bath. If you had an episiotomy or a perineal tear during labor and delivery, your health care provider may tell you not to take baths for a certain length of time. °· Wear a bra that supports your breasts and fits you well. °· If possible, have someone help you with household activities and help care for your baby for at least a few days after   you leave the hospital. °· Keep all follow-up visits for you and your baby as told by your health care provider. This is important. °Contact a health care provider if: °· You have: °? Vaginal discharge that has a bad smell. °? Difficulty urinating. °? Pain when urinating. °? A sudden increase or decrease in the frequency of your bowel movements. °? More redness, swelling, or pain around your episiotomy or vaginal tear. °? More fluid or blood coming from your episiotomy or  vaginal tear. °? Pus or a bad smell coming from your episiotomy or vaginal tear. °? A fever. °? A rash. °? Little or no interest in activities you used to enjoy. °? Questions about caring for yourself or your baby. °· Your episiotomy or vaginal tear feels warm to the touch. °· Your episiotomy or vaginal tear is separating or does not appear to be healing. °· Your breasts are painful, hard, or turn red. °· You feel unusually sad or worried. °· You feel nauseous or you vomit. °· You pass large blood clots from your vagina. If you pass a blood clot from your vagina, save it to show to your health care provider. Do not flush blood clots down the toilet without having your health care provider look at them. °· You urinate more than usual. °· You are dizzy or light-headed. °· You have not breastfed at all and you have not had a menstrual period for 12 weeks after delivery. °· You have stopped breastfeeding and you have not had a menstrual period for 12 weeks after you stopped breastfeeding. °Get help right away if: °· You have: °? Pain that does not go away or does not get better with medicine. °? Chest pain. °? Difficulty breathing. °? Blurred vision or spots in your vision. °? Thoughts about hurting yourself or your baby. °· You develop pain in your abdomen or in one of your legs. °· You develop a severe headache. °· You faint. °· You bleed from your vagina so much that you fill two sanitary pads in one hour. °This information is not intended to replace advice given to you by your health care provider. Make sure you discuss any questions you have with your health care provider. °Document Released: 07/07/2000 Document Revised: 12/22/2015 Document Reviewed: 07/25/2015 °Elsevier Interactive Patient Education © 2018 [employer] ° ° °Postpartum Tubal Ligation, Care After °Refer to this sheet in the next few weeks. These instructions provide you with information about caring for yourself after your procedure. Your health  care provider may also give you more specific instructions. Your treatment has been planned according to current medical practices, but problems sometimes occur. Call your health care provider if you have any problems or questions after your procedure. °What can I expect after the procedure? °After the procedure, it is common to have: °· A sore throat. °· Bruising or pain in your back. °· Nausea or vomiting. °· Dizziness. °· Mild abdominal discomfort or pain, such as cramping, gas pain, or feeling bloated. °· Soreness where the incision was made. °· Tiredness. °· Pain in your shoulders. ° °Follow these instructions at home: °Medicines °· Take over-the-counter and prescription medicines only as told by your health care provider. °· Do not take aspirin because it can cause bleeding. °· Do not drive or operate heavy machinery while taking prescription pain medicine. °Activity °· Rest for the rest of the day. °· Gradually return to your normal activities over the next few days. °· Do not have sex, douche,   or put a tampon or anything else in your vagina for 6 weeks or as long as told by your health care provider. °· Do not lift anything that is heavier than your baby for 2 weeks or as long as told by your health care provider. °Incision care °· Follow instructions from your health care provider about how to take care of your incision. Make sure you: °? Wash your hands with soap and water before you change your bandage (dressing). If soap and water are not available, use hand sanitizer. °? Change your dressing as told by your health care provider. °? Leave stitches (sutures) in place. They may need to stay in place for 2 weeks or longer. °· Check your incision area every day for signs of infection. Check for: °? More redness, swelling, or pain. °? More fluid or blood. °? Warmth. °? Pus or a bad smell. °Other Instructions °· Do not take baths, swim, or use a hot tub until your health care provider approves. You may take  showers. °· Keep all follow-up visits as told by your health care provider. This is important. °Contact a health care provider if: °· You have more redness, swelling, or pain around your incision. °· Your incision feels warm to the touch. °· You have pus or a bad smell coming from your incision. °· The edges of your incision break open after the sutures have been removed. °· Your pain does not improve after 2-3 days. °· You have a rash. °· You repeatedly become dizzy or lightheaded. °· Your pain medicine is not helping. °· You are constipated. °Get help right away if: °· You have a fever. °· You faint. °· You have pain in your abdomen that gets worse. °· You have fluid or blood coming from your sutures. °· You have shortness of breath or difficulty breathing. °· You have chest pain or leg pain. °· You have ongoing nausea or diarrhea. °This information is not intended to replace advice given to you by your health care provider. Make sure you discuss any questions you have with your health care provider. °Document Released: 01/09/2012 Document Revised: 12/13/2015 Document Reviewed: 06/20/2015 °Elsevier Interactive Patient Education © 2018 [employer] ° °

## 2017-07-25 ENCOUNTER — Encounter (HOSPITAL_COMMUNITY): Payer: Self-pay

## 2017-08-20 ENCOUNTER — Ambulatory Visit: Payer: Medicaid Other | Admitting: Obstetrics

## 2017-08-28 ENCOUNTER — Encounter: Payer: Self-pay | Admitting: Obstetrics

## 2017-08-28 ENCOUNTER — Ambulatory Visit (INDEPENDENT_AMBULATORY_CARE_PROVIDER_SITE_OTHER): Payer: Medicaid Other | Admitting: Obstetrics

## 2017-08-28 DIAGNOSIS — Z1389 Encounter for screening for other disorder: Secondary | ICD-10-CM

## 2017-08-28 DIAGNOSIS — N946 Dysmenorrhea, unspecified: Secondary | ICD-10-CM

## 2017-08-28 DIAGNOSIS — Z3049 Encounter for surveillance of other contraceptives: Secondary | ICD-10-CM

## 2017-08-28 MED ORDER — IBUPROFEN 600 MG PO TABS: 600.0000 mg | ORAL_TABLET | Freq: Four times a day (QID) | ORAL | 6 refills | Status: DC

## 2017-08-28 NOTE — Progress Notes (Signed)
..  Post Partum Exam  Shelby EavesMartika J Nichols is a 27 y.o. [phone] female who presents for a postpartum visit. She is 5 weeks postpartum following a spontaneous vaginal delivery. I have fully reviewed the prenatal and intrapartum course. The delivery was at 36.5 gestational weeks.  Anesthesia: epidural. Postpartum course has been good. Baby's course has been good. Baby is feeding by bottle - Similac Advance and Similac with Iron. Bleeding no bleeding. Bowel function is normal. Bladder function is normal. Patient is not sexually active. Contraception method is tubal ligation. Postpartum depression screening:neg  The following portions of the patient's history were reviewed and updated as appropriate: allergies, current medications, past family history, past medical history, past social history, past surgical history and problem list. Last pap smear done 05-21-2017 and was Normal  Review of Systems A comprehensive review of systems was negative.    Objective:  Last menstrual period 11/30/2016, unknown if currently breastfeeding.  General:  alert and no distress   Breasts:  inspection negative, no nipple discharge or bleeding, no masses or nodularity palpable  Lungs: clear to auscultation bilaterally  Heart:  regular rate and rhythm, S1, S2 normal, no murmur, click, rub or gallop  Abdomen: normal findings: no organomegaly and soft, non-tender   Vulva:  not evaluated  Vagina: not evaluated  Cervix:  NOT EVALUATED  Corpus: not examined  Adnexa:  not evaluated  Rectal Exam: Not performed.        Assessment:    1. Postpartum care following vaginal delivery - doing well  2. Dysmenorrhea Rx: - ibuprofen (ADVIL,MOTRIN) 600 MG tablet; Take 1 tablet (600 mg total) by mouth every 6 (six) hours.  Dispense: 30 tablet; Refill: 6  3. Encounter for surveillance of other contraceptive - had PPTL.  Doing well.   Plan:   1. Contraception: tubal ligation 2. Continue PNV's 3. Follow up in: 4 weeks or as  needed.   Brock BadHARLES A. HARPER MD

## 2017-09-25 ENCOUNTER — Ambulatory Visit (INDEPENDENT_AMBULATORY_CARE_PROVIDER_SITE_OTHER): Payer: Medicaid Other | Admitting: Obstetrics

## 2017-09-25 ENCOUNTER — Encounter: Payer: Self-pay | Admitting: Obstetrics

## 2017-09-25 ENCOUNTER — Other Ambulatory Visit: Payer: Self-pay

## 2017-09-25 ENCOUNTER — Other Ambulatory Visit (HOSPITAL_COMMUNITY)
Admission: RE | Admit: 2017-09-25 | Discharge: 2017-09-25 | Disposition: A | Discharge status: Home / Self Care | Payer: Medicaid Other | Source: Ambulatory Visit | Attending: Obstetrics | Admitting: Obstetrics

## 2017-09-25 VITALS — BP 98/62 | HR 90 | Wt 175.0 lb

## 2017-09-25 DIAGNOSIS — N939 Abnormal uterine and vaginal bleeding, unspecified: Secondary | ICD-10-CM

## 2017-09-25 DIAGNOSIS — N898 Other specified noninflammatory disorders of vagina: Secondary | ICD-10-CM | POA: Diagnosis not present

## 2017-09-25 DIAGNOSIS — N760 Acute vaginitis: Secondary | ICD-10-CM | POA: Diagnosis not present

## 2017-09-25 DIAGNOSIS — B9689 Other specified bacterial agents as the cause of diseases classified elsewhere: Secondary | ICD-10-CM | POA: Insufficient documentation

## 2017-09-25 MED ORDER — SECNIDAZOLE 2 G PO PACK: 1.0000 | PACK | Freq: Once | ORAL | 2 refills | Status: AC

## 2017-09-25 NOTE — Progress Notes (Signed)
Patient ID: Shelby Nichols, female   DOB: November 17, 1990, 27 y.o.   MRN: [id number]  Chief Complaint  Patient presents with  . Follow-up    HPI Shelby Nichols is a 27 y.o. female.  Complains of intermittent pinkish vaginal spotting and malodorous vaginal discharge. HPI  Past Medical History:  Diagnosis Date  . Chlamydia   . Trichomonas 04/25/2011    Past Surgical History:  Procedure Laterality Date  . NO PAST SURGERIES    . TUBAL LIGATION Bilateral 07/22/2017   Procedure: POST PARTUM TUBAL LIGATION;  Surgeon: Willodean Rosenthal, MD;  Location: [facility];  Service: Gynecology;  Laterality: Bilateral;  . VAGINAL DELIVERY      Family History  Problem Relation Age of Onset  . Cancer Neg Hx   . Diabetes Neg Hx   . Hypertension Neg Hx     Social History Social History   Tobacco Use  . Smoking status: Former Smoker    Packs/day: 0.10    Years: 3.00    Pack years: 0.30    Types: Cigarettes  . Smokeless tobacco: Never Used  Substance Use Topics  . Alcohol use: Yes    Comment: social, not while pregnant  . Drug use: No    No Known Allergies  Current Outpatient Medications  Medication Sig Dispense Refill  . ibuprofen (ADVIL,MOTRIN) 600 MG tablet Take 1 tablet (600 mg total) by mouth every 6 (six) hours. 30 tablet 6  . oxyCODONE (OXY IR/ROXICODONE) 5 MG immediate release tablet Take 1 tablet (5 mg total) by mouth every 4 (four) hours as needed for moderate pain. 10 tablet 0  . Prenat-Fe Poly-Methfol-FA-DHA (VITAFOL ULTRA) 29-0.6-0.4-200 MG CAPS Take 1 tablet by mouth daily. 30 capsule 12  . Secnidazole (SOLOSEC) 2 g PACK Take 1 packet by mouth once for 1 dose. 1 each 2   No current facility-administered medications for this visit.     Review of Systems Review of Systems Constitutional: negative for fatigue and weight loss Respiratory: negative for cough and wheezing Cardiovascular: negative for chest pain, fatigue and palpitations Gastrointestinal:  negative for abdominal pain and change in bowel habits Genitourinary: positive for intermittent vaginal spotting and malodorous vaginal discharge Integument/breast: negative for nipple discharge Musculoskeletal:negative for myalgias Neurological: negative for gait problems and tremors Behavioral/Psych: negative for abusive relationship, depression Endocrine: negative for temperature intolerance      Blood pressure 98/62, pulse 90, weight 175 lb (79.4 kg), last menstrual period 11/30/2016, not currently breastfeeding.  Physical Exam Physical Exam General:   alert  Skin:   no rash or abnormalities  Lungs:   clear to auscultation bilaterally  Heart:   regular rate and rhythm, S1, S2 normal, no murmur, click, rub or gallop  Breasts:   normal without suspicious masses, skin or nipple changes or axillary nodes  Abdomen:  normal findings: no organomegaly, soft, non-tender and no hernia  Pelvis:  External genitalia: normal general appearance Urinary system: urethral meatus normal and bladder without fullness, nontender Vaginal: normal without tenderness, induration or masses Cervix: normal appearance Adnexa: normal bimanual exam Uterus: anteverted and non-tender, normal size    50% of 20 min visit spent on counseling and coordination of care.   Data Reviewed Wet Prep  Assessment     1. Abnormal uterine bleeding (AUB) - R/O Chlamydia  2. Vaginal discharge, probably BV Rx: - Cervicovaginal ancillary only - Secnidazole (SOLOSEC) 2 g PACK; Take 1 packet by mouth once for 1 dose.  Dispense: 1 each; Refill: 2  Plan    Follow up in 8 months for Annual   No orders of the defined types were placed in this encounter.  Meds ordered this encounter  Medications  . Secnidazole (SOLOSEC) 2 g PACK    Sig: Take 1 packet by mouth once for 1 dose.    Dispense:  1 each    Refill:  2    Brock BadHARLES A. Urho Rio MD

## 2017-09-25 NOTE — Progress Notes (Signed)
Presents for FU to PP. C/o Intermittent spotting and malodorous discharge.  Denies NV, fever, pain.

## 2017-09-26 LAB — CERVICOVAGINAL ANCILLARY ONLY
BACTERIAL VAGINITIS: POSITIVE — AB
CANDIDA VAGINITIS: NEGATIVE
CHLAMYDIA, DNA PROBE: NEGATIVE
NEISSERIA GONORRHEA: NEGATIVE
Trichomonas: NEGATIVE

## 2017-09-27 ENCOUNTER — Other Ambulatory Visit: Payer: Self-pay | Admitting: Obstetrics

## 2017-09-27 DIAGNOSIS — B9689 Other specified bacterial agents as the cause of diseases classified elsewhere: Secondary | ICD-10-CM

## 2017-09-27 DIAGNOSIS — N760 Acute vaginitis: Principal | ICD-10-CM

## 2017-09-27 MED ORDER — SECNIDAZOLE 2 G PO PACK: 1.0000 | PACK | Freq: Once | ORAL | 2 refills | Status: AC

## 2017-12-01 ENCOUNTER — Encounter (HOSPITAL_COMMUNITY): Payer: Self-pay | Admitting: Emergency Medicine

## 2017-12-01 ENCOUNTER — Emergency Department (HOSPITAL_COMMUNITY): Payer: Medicaid Other

## 2017-12-01 ENCOUNTER — Other Ambulatory Visit: Payer: Self-pay

## 2017-12-01 ENCOUNTER — Emergency Department (HOSPITAL_COMMUNITY)
Admission: EM | Admit: 2017-12-01 | Discharge: 2017-12-01 | Disposition: A | Discharge status: Home / Self Care | Payer: Medicaid Other | Source: Home / Self Care | Attending: Emergency Medicine | Admitting: Emergency Medicine

## 2017-12-01 DIAGNOSIS — Z87891 Personal history of nicotine dependence: Secondary | ICD-10-CM

## 2017-12-01 DIAGNOSIS — E876 Hypokalemia: Secondary | ICD-10-CM | POA: Diagnosis present

## 2017-12-01 DIAGNOSIS — K8050 Calculus of bile duct without cholangitis or cholecystitis without obstruction: Secondary | ICD-10-CM

## 2017-12-01 DIAGNOSIS — K5900 Constipation, unspecified: Secondary | ICD-10-CM | POA: Diagnosis present

## 2017-12-01 DIAGNOSIS — K8067 Calculus of gallbladder and bile duct with acute and chronic cholecystitis with obstruction: Principal | ICD-10-CM | POA: Diagnosis present

## 2017-12-01 LAB — URINALYSIS, ROUTINE W REFLEX MICROSCOPIC
BACTERIA UA: NONE SEEN
BILIRUBIN URINE: NEGATIVE
Glucose, UA: NEGATIVE mg/dL
Ketones, ur: 5 mg/dL — AB
Nitrite: NEGATIVE
Protein, ur: 30 mg/dL — AB
RBC / HPF: >50 RBC/hpf — ABNORMAL HIGH (ref 0–5)
Specific Gravity, Urine: 1.031 — ABNORMAL HIGH (ref 1.005–1.030)
pH: 5 (ref 5.0–8.0)

## 2017-12-01 LAB — COMPREHENSIVE METABOLIC PANEL
ALK PHOS: 67 U/L (ref 38–126)
ALT: 18 U/L (ref 14–54)
AST: 23 U/L (ref 15–41)
Albumin: 4.2 g/dL (ref 3.5–5.0)
Anion gap: 12 (ref 5–15)
BUN: 8 mg/dL (ref 6–20)
CHLORIDE: 104 mmol/L (ref 101–111)
CO2: 22 mmol/L (ref 22–32)
CREATININE: 0.8 mg/dL (ref 0.44–1.00)
Calcium: 8.9 mg/dL (ref 8.9–10.3)
GFR calc Af Amer: >60 mL/min (ref >60)
GFR calc non Af Amer: >60 mL/min (ref >60)
GLUCOSE: 119 mg/dL — AB (ref 65–99)
Potassium: 3 mmol/L — ABNORMAL LOW (ref 3.5–5.1)
Sodium: 138 mmol/L (ref 135–145)
Total Bilirubin: 0.6 mg/dL (ref 0.3–1.2)
Total Protein: 7.6 g/dL (ref 6.5–8.1)

## 2017-12-01 LAB — I-STAT BETA HCG BLOOD, ED (MC, WL, AP ONLY)

## 2017-12-01 LAB — CBC
HEMATOCRIT: 40.5 % (ref 36.0–46.0)
Hemoglobin: 13.5 g/dL (ref 12.0–15.0)
MCH: 29.9 pg (ref 26.0–34.0)
MCHC: 33.3 g/dL (ref 30.0–36.0)
MCV: 89.6 fL (ref 78.0–100.0)
Platelets: 294 10*3/uL (ref 150–400)
RBC: 4.52 MIL/uL (ref 3.87–5.11)
RDW: 12.7 % (ref 11.5–15.5)
WBC: 9.1 10*3/uL (ref 4.0–10.5)

## 2017-12-01 LAB — LIPASE, BLOOD: Lipase: 30 U/L (ref 11–51)

## 2017-12-01 MED ORDER — POTASSIUM CHLORIDE 10 MEQ/100ML IV SOLN
10.0000 meq | INTRAVENOUS | Status: AC
  Administered 2017-12-01 (×2): 10 meq via INTRAVENOUS
  Filled 2017-12-01 (×2): qty 100

## 2017-12-01 MED ORDER — ONDANSETRON 4 MG PO TBDP: 4.0000 mg | ORAL_TABLET | Freq: Three times a day (TID) | ORAL | 0 refills | Status: DC | PRN

## 2017-12-01 MED ORDER — SODIUM CHLORIDE 0.9 % IV BOLUS
1000.0000 mL | Freq: Once | INTRAVENOUS | Status: AC
  Administered 2017-12-01: 1000 mL via INTRAVENOUS

## 2017-12-01 MED ORDER — ONDANSETRON 4 MG PO TBDP
4.0000 mg | ORAL_TABLET | Freq: Once | ORAL | Status: DC | PRN
Start: 2017-12-01 — End: 2017-12-02

## 2017-12-01 MED ORDER — MORPHINE SULFATE (PF) 2 MG/ML IV SOLN
2.0000 mg | Freq: Once | INTRAVENOUS | Status: AC
  Administered 2017-12-01: 2 mg via INTRAVENOUS
  Filled 2017-12-01: qty 1

## 2017-12-01 MED ORDER — KETOROLAC TROMETHAMINE 30 MG/ML IJ SOLN
30.0000 mg | Freq: Once | INTRAMUSCULAR | Status: AC
  Administered 2017-12-01: 30 mg via INTRAVENOUS
  Filled 2017-12-01: qty 1

## 2017-12-01 MED ORDER — OXYCODONE-ACETAMINOPHEN 5-325 MG PO TABS: 1.0000 | ORAL_TABLET | Freq: Three times a day (TID) | ORAL | 0 refills | Status: DC | PRN

## 2017-12-01 MED ORDER — ONDANSETRON 4 MG PO TBDP
4.0000 mg | ORAL_TABLET | Freq: Once | ORAL | Status: AC | PRN
  Administered 2017-12-01: 4 mg via ORAL
  Filled 2017-12-01: qty 1

## 2017-12-01 NOTE — ED Triage Notes (Signed)
Patient complaining of right upper abdominal pain. Patient states it started around 12 am. Patient states she has been throwing up and is nauseated.

## 2017-12-01 NOTE — ED Provider Notes (Signed)
[facility]-EMERGENCY DEPT Provider Note   CSN: [id number] Arrival date & time: 12/01/17  0417     History   Chief Complaint Chief Complaint  Patient presents with  . Abdominal Pain  . Emesis  . Nausea    HPI Shelby Nichols is a 27 y.o. female who presents to ED for evaluation of 6-hour history of right upper quadrant abdominal pain, several episodes of nonbloody, nonbilious emesis.  She took 1 dose of ibuprofen with no improvement in her symptoms.  She states that she has been told in the past that she had "gallbladder problems."  Denies any suspicious food ingestions or sick contacts with similar symptoms.  She denies any dysuria, vaginal complaints, diarrhea, constipation, prior abdominal surgeries, alcohol, tobacco or other drug use.  HPI  Past Medical History:  Diagnosis Date  . Chlamydia   . Trichomonas 04/25/2011    Patient Active Problem List   Diagnosis Date Noted  . Encounter for sterilization 07/22/2017  . Preterm premature rupture of membranes 07/21/2017  . SVD (spontaneous vaginal delivery) 07/21/2017  . Supervision of normal pregnancy, antepartum 05/18/2017  . NVD (normal vaginal delivery) 10/03/2015    Past Surgical History:  Procedure Laterality Date  . NO PAST SURGERIES    . TUBAL LIGATION Bilateral 07/22/2017   Procedure: POST PARTUM TUBAL LIGATION;  Surgeon: Willodean Rosenthal, MD;  Location: [facility];  Service: Gynecology;  Laterality: Bilateral;  . VAGINAL DELIVERY       OB History    Gravida  7   Para  7   Term  5   Preterm  2   AB  0   Living  7     SAB  0   TAB  0   Ectopic  0   Multiple  0   Live Births  7            Home Medications    Prior to Admission medications   Medication Sig Start Date End Date Taking? Authorizing Provider  ibuprofen (ADVIL,MOTRIN) 600 MG tablet Take 1 tablet (600 mg total) by mouth every 6 (six) hours. Patient taking differently: Take 600 mg by mouth  every 6 (six) hours as needed for headache, mild pain, moderate pain or cramping.  08/28/17  Yes Brock Bad, MD  ondansetron (ZOFRAN ODT) 4 MG disintegrating tablet Take 1 tablet (4 mg total) by mouth every 8 (eight) hours as needed for nausea or vomiting. 12/01/17   Raja Caputi, PA-C  oxyCODONE (OXY IR/ROXICODONE) 5 MG immediate release tablet Take 1 tablet (5 mg total) by mouth every 4 (four) hours as needed for moderate pain. Patient not taking: Reported on 12/01/2017 07/23/17   Willodean Rosenthal, MD  oxyCODONE-acetaminophen (PERCOCET/ROXICET) 5-325 MG tablet Take 1 tablet by mouth every 8 (eight) hours as needed for severe pain. 12/01/17   Abigail Marsiglia, PA-C  Prenat-Fe Poly-Methfol-FA-DHA (VITAFOL ULTRA) 29-0.6-0.4-200 MG CAPS Take 1 tablet by mouth daily. Patient not taking: Reported on 12/01/2017 03/07/17   Constant, Peggy, MD    Family History Family History  Problem Relation Age of Onset  . Cancer Neg Hx   . Diabetes Neg Hx   . Hypertension Neg Hx     Social History Social History   Tobacco Use  . Smoking status: Former Smoker    Packs/day: 0.10    Years: 3.00    Pack years: 0.30    Types: Cigarettes  . Smokeless tobacco: Never Used  Substance Use Topics  .  Alcohol use: Yes    Comment: social, not while pregnant  . Drug use: No     Allergies   Patient has no known allergies.   Review of Systems Review of Systems  Constitutional: Negative for appetite change, chills and fever.  HENT: Negative for ear pain, rhinorrhea, sneezing and sore throat.   Eyes: Negative for photophobia and visual disturbance.  Respiratory: Negative for cough, chest tightness, shortness of breath and wheezing.   Cardiovascular: Negative for chest pain and palpitations.  Gastrointestinal: Positive for abdominal pain, nausea and vomiting. Negative for blood in stool, constipation and diarrhea.  Genitourinary: Negative for dysuria, hematuria and urgency.  Musculoskeletal: Negative for  myalgias.  Skin: Negative for rash.  Neurological: Negative for dizziness, weakness and light-headedness.     Physical Exam Updated Vital Signs BP 129/82   Pulse 60   Temp 99.3 F (37.4 C) (Oral)   Resp 20   Ht 5' (1.524 m)   SpO2 96%   BMI 34.18 kg/m   Physical Exam  Constitutional: She appears well-developed and well-nourished. No distress.  Uncomfortable.  HENT:  Head: Normocephalic and atraumatic.  Nose: Nose normal.  Eyes: Conjunctivae and EOM are normal. Left eye exhibits no discharge. No scleral icterus.  Neck: Normal range of motion. Neck supple.  Cardiovascular: Normal rate, regular rhythm, normal heart sounds and intact distal pulses. Exam reveals no gallop and no friction rub.  No murmur heard. Pulmonary/Chest: Effort normal and breath sounds normal. No respiratory distress.  Abdominal: Soft. Bowel sounds are normal. She exhibits no distension. There is tenderness in the right upper quadrant and epigastric area. There is no rebound and no guarding.  Musculoskeletal: Normal range of motion. She exhibits no edema.  Neurological: She is alert. She exhibits normal muscle tone. Coordination normal.  Skin: Skin is warm and dry. No rash noted.  Psychiatric: She has a normal mood and affect.  Nursing note and vitals reviewed.    ED Treatments / Results  Labs (all labs ordered are listed, but only abnormal results are displayed) Labs Reviewed  COMPREHENSIVE METABOLIC PANEL - Abnormal; Notable for the following components:      Result Value   Potassium 3.0 (*)    Glucose, Bld 119 (*)    All other components within normal limits  URINALYSIS, ROUTINE W REFLEX MICROSCOPIC - Abnormal; Notable for the following components:   APPearance CLOUDY (*)    Specific Gravity, Urine 1.031 (*)    Hgb urine dipstick LARGE (*)    Ketones, ur 5 (*)    Protein, ur 30 (*)    Leukocytes, UA TRACE (*)    RBC / HPF >50 (*)    All other components within normal limits  LIPASE, BLOOD    CBC  I-STAT BETA HCG BLOOD, ED ([facility], [facility], [facility] ONLY)    EKG None  Radiology US Abdomen Limited  Result Date: 12/01/2017 CLINICAL DATA:  Right upper quadrant abdomen pain since 12 a.m. with nausea and vomiting EXAM: ULTRASOUND ABDOMEN LIMITED RIGHT UPPER QUADRANT COMPARISON:  None. FINDINGS: Gallbladder: Sludge and gallstones are identified in the gallbladder. No wall thickening visualized. No sonographic Murphy sign noted by sonographer. Common bile duct: Diameter: 8.2 mm Liver: There is a 1.1 x 1.3 x 0.9 cm hyperechoic lesion in the right lobe liver. Within normal limits in parenchymal echogenicity. Portal vein is patent on color Doppler imaging with normal direction of blood flow towards the liver. IMPRESSION: Sludge and gallstones are identified in the gallbladder. There is no  sonographic evidence of acute cholecystitis. The common bile duct is dilated at 8.2 mm. Choledocholithiasis is not excluded. 1.3 cm hyperechoic lesion in the right lobe liver. This is nonspecific. Differential diagnosis includes but is not limited to hemangioma. Electronically Signed   By: Sherian Rein M.D.   On: 12/01/2017 09:25    Procedures Procedures (including critical care time)  Medications Ordered in ED Medications  ondansetron (ZOFRAN-ODT) disintegrating tablet 4 mg (4 mg Oral Given 12/01/17 0434)  sodium chloride 0.9 % bolus 1,000 mL (0 mLs Intravenous Stopped 12/01/17 1036)  ketorolac (TORADOL) 30 MG/ML injection 30 mg (30 mg Intravenous Given 12/01/17 0636)  potassium chloride 10 mEq in 100 mL IVPB (0 mEq Intravenous Stopped 12/01/17 0950)  morphine 2 MG/ML injection 2 mg (2 mg Intravenous Given 12/01/17 0654)  morphine 2 MG/ML injection 2 mg (2 mg Intravenous Given 12/01/17 0918)     Initial Impression / Assessment and Plan / ED Course  I have reviewed the triage vital signs and the nursing notes.  Pertinent labs & imaging results that were available during my care of the patient were reviewed by me and  considered in my medical decision making (see chart for details).  Clinical Course as of Dec 02 1050  Sat Dec 01, 2017  1028 GI specialist will evaluate the patient.   [HK]    Clinical Course User Index [HK] Dietrich Pates, PA-C    Patient presents to ED for evaluation of 6-hour history of right upper quadrant abdominal pain, several episodes of nonbloody, nonbilious emesis.  She has been told in the past that she had "gallbladder problems."  Denies any bowel changes, vaginal complaints, dysuria, prior abdominal surgeries, alcohol, tobacco or other drug use.  On physical exam patient initially appeared uncomfortable.  She does have right upper quadrant and epigastric tenderness to palpation.  No lower abdominal tenderness to palpation.  No rebound or guarding noted.  She is afebrile.  Other vital signs are within normal limits.  Lab work including CBC, CMP, lipase, hCG unremarkable.  WBC count and LFTs within normal limits.  Urinalysis with numerous RBCs, ketones and protein.  Patient states that she is currently on her menstrual cycle which could be the cause of her blood.  Right upper quadrant ultrasound shows gallbladder sludge and gallstones with no evidence of acute cholecystitis.  Also shows dilation of common bile duct and choledocholithiasis cannot be excluded.  I consulted [facility] who evaluated the patient at bedside.  They state that patient is stable enough for outpatient follow-up at the clinic for further testing.  Patient reports improvement in her symptoms with medications given here.  She is able to tolerate p.o. intake without difficulty.  Will discharge home with antiemetics, pain medication, follow-up with GI instructions on changes to diet.  Advised to return to ED for any severe worsening symptoms.  Final Clinical Impressions(s) / ED Diagnoses   Final diagnoses:  Calculus of bile duct without cholecystitis and without obstruction    ED Discharge Orders        Ordered     oxyCODONE-acetaminophen (PERCOCET/ROXICET) 5-325 MG tablet  Every 8 hours PRN     12/01/17 1052    ondansetron (ZOFRAN ODT) 4 MG disintegrating tablet  Every 8 hours PRN     12/01/17 1052       Dietrich Pates, PA-C 12/01/17 1052    Derwood Kaplan, MD 12/01/17 2332

## 2017-12-01 NOTE — ED Notes (Signed)
She is resting quietly and is relaxed in appearance. As I write this, the ultrasonographer is beginning her u/s study.

## 2017-12-02 ENCOUNTER — Inpatient Hospital Stay (HOSPITAL_COMMUNITY): Payer: Medicaid Other

## 2017-12-02 ENCOUNTER — Ambulatory Visit: Payer: Self-pay | Admitting: Surgery

## 2017-12-02 ENCOUNTER — Inpatient Hospital Stay (HOSPITAL_COMMUNITY)
Admission: EM | Admit: 2017-12-02 | Discharge: 2017-12-04 | DRG: 419 | Disposition: A | Discharge status: Home / Self Care | Payer: Medicaid Other | Attending: Internal Medicine | Admitting: Internal Medicine

## 2017-12-02 ENCOUNTER — Encounter (HOSPITAL_COMMUNITY): Payer: Self-pay | Admitting: Internal Medicine

## 2017-12-02 DIAGNOSIS — E876 Hypokalemia: Secondary | ICD-10-CM | POA: Diagnosis present

## 2017-12-02 DIAGNOSIS — K8067 Calculus of gallbladder and bile duct with acute and chronic cholecystitis with obstruction: Secondary | ICD-10-CM | POA: Diagnosis present

## 2017-12-02 DIAGNOSIS — K8001 Calculus of gallbladder with acute cholecystitis with obstruction: Secondary | ICD-10-CM | POA: Diagnosis present

## 2017-12-02 DIAGNOSIS — K819 Cholecystitis, unspecified: Secondary | ICD-10-CM | POA: Diagnosis present

## 2017-12-02 DIAGNOSIS — R7401 Elevation of levels of liver transaminase levels: Secondary | ICD-10-CM

## 2017-12-02 DIAGNOSIS — R1011 Right upper quadrant pain: Secondary | ICD-10-CM | POA: Diagnosis not present

## 2017-12-02 DIAGNOSIS — K5900 Constipation, unspecified: Secondary | ICD-10-CM | POA: Diagnosis present

## 2017-12-02 DIAGNOSIS — R109 Unspecified abdominal pain: Secondary | ICD-10-CM | POA: Diagnosis present

## 2017-12-02 DIAGNOSIS — Z87891 Personal history of nicotine dependence: Secondary | ICD-10-CM | POA: Diagnosis not present

## 2017-12-02 DIAGNOSIS — Z419 Encounter for procedure for purposes other than remedying health state, unspecified: Secondary | ICD-10-CM

## 2017-12-02 DIAGNOSIS — R740 Nonspecific elevation of levels of transaminase and lactic acid dehydrogenase [LDH]: Secondary | ICD-10-CM | POA: Diagnosis not present

## 2017-12-02 DIAGNOSIS — D72829 Elevated white blood cell count, unspecified: Secondary | ICD-10-CM

## 2017-12-02 LAB — CBC
HCT: 38.1 % (ref 36.0–46.0)
HCT: 40.7 % (ref 36.0–46.0)
Hemoglobin: 12.4 g/dL (ref 12.0–15.0)
Hemoglobin: 13.7 g/dL (ref 12.0–15.0)
MCH: 29.5 pg (ref 26.0–34.0)
MCH: 29.8 pg (ref 26.0–34.0)
MCHC: 32.5 g/dL (ref 30.0–36.0)
MCHC: 33.7 g/dL (ref 30.0–36.0)
MCV: 88.7 fL (ref 78.0–100.0)
MCV: 90.5 fL (ref 78.0–100.0)
PLATELETS: 268 10*3/uL (ref 150–400)
PLATELETS: 326 10*3/uL (ref 150–400)
RBC: 4.21 MIL/uL (ref 3.87–5.11)
RBC: 4.59 MIL/uL (ref 3.87–5.11)
RDW: 12.7 % (ref 11.5–15.5)
RDW: 12.9 % (ref 11.5–15.5)
WBC: 18.3 10*3/uL — ABNORMAL HIGH (ref 4.0–10.5)
WBC: 18.6 10*3/uL — AB (ref 4.0–10.5)

## 2017-12-02 LAB — COMPREHENSIVE METABOLIC PANEL
ALBUMIN: 3.4 g/dL — AB (ref 3.5–5.0)
ALBUMIN: 4.3 g/dL (ref 3.5–5.0)
ALK PHOS: 93 U/L (ref 38–126)
ALT: 101 U/L — ABNORMAL HIGH (ref 14–54)
ALT: 121 U/L — AB (ref 14–54)
AST: 105 U/L — AB (ref 15–41)
AST: 80 U/L — AB (ref 15–41)
Alkaline Phosphatase: 76 U/L (ref 38–126)
Anion gap: 13 (ref 5–15)
Anion gap: 14 (ref 5–15)
BUN: 5 mg/dL — ABNORMAL LOW (ref 6–20)
BUN: 6 mg/dL (ref 6–20)
CHLORIDE: 105 mmol/L (ref 101–111)
CO2: 20 mmol/L — AB (ref 22–32)
CO2: 21 mmol/L — ABNORMAL LOW (ref 22–32)
CREATININE: 0.76 mg/dL (ref 0.44–1.00)
Calcium: 8.2 mg/dL — ABNORMAL LOW (ref 8.9–10.3)
Calcium: 9.5 mg/dL (ref 8.9–10.3)
Chloride: 106 mmol/L (ref 101–111)
Creatinine, Ser: 0.65 mg/dL (ref 0.44–1.00)
GFR calc Af Amer: >60 mL/min (ref >60)
GFR calc Af Amer: >60 mL/min (ref >60)
GFR calc non Af Amer: >60 mL/min (ref >60)
GFR calc non Af Amer: >60 mL/min (ref >60)
GLUCOSE: 115 mg/dL — AB (ref 65–99)
GLUCOSE: 132 mg/dL — AB (ref 65–99)
POTASSIUM: 3.5 mmol/L (ref 3.5–5.1)
Potassium: 3.4 mmol/L — ABNORMAL LOW (ref 3.5–5.1)
SODIUM: 139 mmol/L (ref 135–145)
SODIUM: 140 mmol/L (ref 135–145)
Total Bilirubin: 1 mg/dL (ref 0.3–1.2)
Total Bilirubin: 1.4 mg/dL — ABNORMAL HIGH (ref 0.3–1.2)
Total Protein: 6.5 g/dL (ref 6.5–8.1)
Total Protein: 8.1 g/dL (ref 6.5–8.1)

## 2017-12-02 LAB — DIFFERENTIAL
BASOS ABS: 0 10*3/uL (ref 0.0–0.1)
Basophils Relative: 0 %
EOS ABS: 0 10*3/uL (ref 0.0–0.7)
EOS PCT: 0 %
Lymphocytes Relative: 8 %
Lymphs Abs: 1.3 10*3/uL (ref 0.7–4.0)
Monocytes Absolute: 1.3 10*3/uL — ABNORMAL HIGH (ref 0.1–1.0)
Monocytes Relative: 7 %
NEUTROS PCT: 85 %
Neutro Abs: 15.1 10*3/uL — ABNORMAL HIGH (ref 1.7–7.7)

## 2017-12-02 LAB — URINALYSIS, ROUTINE W REFLEX MICROSCOPIC
Bilirubin Urine: NEGATIVE
Glucose, UA: NEGATIVE mg/dL
KETONES UR: 80 mg/dL — AB
Nitrite: NEGATIVE
PH: 5 (ref 5.0–8.0)
Protein, ur: 100 mg/dL — AB
Specific Gravity, Urine: 1.028 (ref 1.005–1.030)

## 2017-12-02 LAB — LIPASE, BLOOD: LIPASE: 22 U/L (ref 11–51)

## 2017-12-02 LAB — SURGICAL PCR SCREEN
MRSA, PCR: NEGATIVE
STAPHYLOCOCCUS AUREUS: NEGATIVE

## 2017-12-02 LAB — I-STAT CG4 LACTIC ACID, ED: LACTIC ACID, VENOUS: 1.42 mmol/L (ref 0.5–1.9)

## 2017-12-02 LAB — PREGNANCY, URINE: Preg Test, Ur: NEGATIVE

## 2017-12-02 MED ORDER — PIPERACILLIN-TAZOBACTAM 3.375 G IVPB
3.3750 g | Freq: Three times a day (TID) | INTRAVENOUS | Status: DC
Start: 2017-12-02 — End: 2017-12-04
  Administered 2017-12-02 – 2017-12-04 (×6): 3.375 g via INTRAVENOUS
  Filled 2017-12-02 (×7): qty 50

## 2017-12-02 MED ORDER — MORPHINE SULFATE (PF) 4 MG/ML IV SOLN
4.0000 mg | INTRAVENOUS | Status: DC | PRN
  Administered 2017-12-02 (×2): 4 mg via INTRAVENOUS
  Filled 2017-12-02 (×2): qty 1

## 2017-12-02 MED ORDER — ONDANSETRON HCL 4 MG/2ML IJ SOLN
4.0000 mg | Freq: Four times a day (QID) | INTRAMUSCULAR | Status: DC | PRN
  Administered 2017-12-02 – 2017-12-03 (×2): 4 mg via INTRAVENOUS
  Filled 2017-12-02: qty 2

## 2017-12-02 MED ORDER — SODIUM CHLORIDE 0.9 % IV BOLUS
1000.0000 mL | Freq: Once | INTRAVENOUS | Status: AC
  Administered 2017-12-02: 1000 mL via INTRAVENOUS

## 2017-12-02 MED ORDER — ONDANSETRON HCL 4 MG/2ML IJ SOLN
4.0000 mg | Freq: Once | INTRAMUSCULAR | Status: AC
  Administered 2017-12-02: 4 mg via INTRAVENOUS
  Filled 2017-12-02: qty 2

## 2017-12-02 MED ORDER — FAMOTIDINE IN NACL 20-0.9 MG/50ML-% IV SOLN: 20.0000 mg | Freq: Two times a day (BID) | INTRAVENOUS | Status: DC

## 2017-12-02 MED ORDER — SODIUM CHLORIDE 0.9 % IV SOLN
2.0000 g | Freq: Once | INTRAVENOUS | Status: AC
  Administered 2017-12-02: 2 g via INTRAVENOUS
  Filled 2017-12-02: qty 20

## 2017-12-02 MED ORDER — CHLORHEXIDINE GLUCONATE CLOTH 2 % EX PADS
6.0000 | MEDICATED_PAD | Freq: Once | CUTANEOUS | Status: AC
  Administered 2017-12-02: 6 via TOPICAL

## 2017-12-02 MED ORDER — HEPARIN SODIUM (PORCINE) 5000 UNIT/ML IJ SOLN
5000.0000 [IU] | INTRAMUSCULAR | Status: AC
  Administered 2017-12-03: 5000 [IU] via SUBCUTANEOUS
  Filled 2017-12-02: qty 1

## 2017-12-02 MED ORDER — IBUPROFEN 200 MG PO TABS
400.0000 mg | ORAL_TABLET | Freq: Four times a day (QID) | ORAL | Status: DC | PRN
  Administered 2017-12-02: 400 mg via ORAL
  Filled 2017-12-02: qty 2

## 2017-12-02 MED ORDER — CEFAZOLIN SODIUM-DEXTROSE 2-4 GM/100ML-% IV SOLN
2.0000 g | INTRAVENOUS | Status: DC
  Filled 2017-12-02: qty 100

## 2017-12-02 MED ORDER — POTASSIUM CHLORIDE IN NACL 20-0.9 MEQ/L-% IV SOLN
INTRAVENOUS | Status: DC
  Administered 2017-12-02: 04:00:00 via INTRAVENOUS
  Filled 2017-12-02: qty 1000

## 2017-12-02 MED ORDER — SODIUM CHLORIDE 0.9 % IV SOLN
INTRAVENOUS | Status: DC
  Administered 2017-12-02: 15:00:00 via INTRAVENOUS

## 2017-12-02 MED ORDER — OXYCODONE HCL 5 MG PO TABS
10.0000 mg | ORAL_TABLET | Freq: Once | ORAL | Status: AC
  Administered 2017-12-02: 10 mg via ORAL
  Filled 2017-12-02: qty 2

## 2017-12-02 MED ORDER — HYDROMORPHONE HCL 1 MG/ML IJ SOLN
1.0000 mg | INTRAMUSCULAR | Status: DC | PRN
  Administered 2017-12-02 – 2017-12-04 (×10): 1 mg via INTRAVENOUS
  Filled 2017-12-02 (×11): qty 1

## 2017-12-02 MED ORDER — ENOXAPARIN SODIUM 40 MG/0.4ML ~~LOC~~ SOLN: 40.0000 mg | SUBCUTANEOUS | Status: DC

## 2017-12-02 MED ORDER — SODIUM CHLORIDE 0.9 % IV SOLN
80.0000 mg | Freq: Once | INTRAVENOUS | Status: AC
  Administered 2017-12-02: 80 mg via INTRAVENOUS
  Filled 2017-12-02: qty 80

## 2017-12-02 MED ORDER — SODIUM CHLORIDE 0.9 % IV SOLN
8.0000 mg/h | INTRAVENOUS | Status: DC
  Administered 2017-12-02: 8 mg/h via INTRAVENOUS
  Filled 2017-12-02 (×2): qty 80

## 2017-12-02 NOTE — ED Provider Notes (Signed)
[facility]-EMERGENCY DEPT Provider Note   CSN: [id number] Arrival date & time: 12/01/17  2320     History   Chief Complaint Chief Complaint  Patient presents with  . Abdominal Pain    HPI Shelby Nichols is a 27 y.o. female.  The history is provided by the patient.  She is 4-1/2 months postpartum and was seen in emergency department yesterday diagnosed with cholelithiasis and discharged with prescriptions for ondansetron and oxycodone-acetaminophen.  Since going home, she has had persistent and worsening right upper quadrant pain radiating to the back, and ongoing nausea with vomiting.  She is vomited at least 5 times since going home.  She has not been able to hold anything down, including her medications.  She was not aware of any fever, but has had chills and sweats.  She rates pain at 10/10.  Nothing makes it better, nothing makes it worse.  Past Medical History:  Diagnosis Date  . Chlamydia   . Trichomonas 04/25/2011    Patient Active Problem List   Diagnosis Date Noted  . Encounter for sterilization 07/22/2017  . Preterm premature rupture of membranes 07/21/2017  . SVD (spontaneous vaginal delivery) 07/21/2017  . Supervision of normal pregnancy, antepartum 05/18/2017  . NVD (normal vaginal delivery) 10/03/2015    Past Surgical History:  Procedure Laterality Date  . NO PAST SURGERIES    . TUBAL LIGATION Bilateral 07/22/2017   Procedure: POST PARTUM TUBAL LIGATION;  Surgeon: Willodean Rosenthal, MD;  Location: [facility]ING SU[facility];  Service: Gynecology;  Laterality: Bilateral;  . VAGINAL DELIVERY       OB History    Gravida  7   Para  7   Term  5   Preterm  2   AB  0   Living  7     SAB  0   TAB  0   Ectopic  0   Multiple  0   Live Births  7            Home Medications    Prior to Admission medications   Medication Sig Start Date End Date Taking? Authorizing Provider  ibuprofen (ADVIL,MOTRIN) 600 MG tablet Take  1 tablet (600 mg total) by mouth every 6 (six) hours. Patient taking differently: Take 600 mg by mouth every 6 (six) hours as needed for headache, mild pain, moderate pain or cramping.  08/28/17  Yes Brock Bad, MD  ondansetron (ZOFRAN ODT) 4 MG disintegrating tablet Take 1 tablet (4 mg total) by mouth every 8 (eight) hours as needed for nausea or vomiting. 12/01/17   Khatri, Hina, PA-C  oxyCODONE (OXY IR/ROXICODONE) 5 MG immediate release tablet Take 1 tablet (5 mg total) by mouth every 4 (four) hours as needed for moderate pain. Patient not taking: Reported on 12/01/2017 07/23/17   Willodean Rosenthal, MD  oxyCODONE-acetaminophen (PERCOCET/ROXICET) 5-325 MG tablet Take 1 tablet by mouth every 8 (eight) hours as needed for severe pain. 12/01/17   Khatri, Hina, PA-C  Prenat-Fe Poly-Methfol-FA-DHA (VITAFOL ULTRA) 29-0.6-0.4-200 MG CAPS Take 1 tablet by mouth daily. Patient not taking: Reported on 12/01/2017 03/07/17   Constant, Peggy, MD    Family History Family History  Problem Relation Age of Onset  . Cancer Neg Hx   . Diabetes Neg Hx   . Hypertension Neg Hx     Social History Social History   Tobacco Use  . Smoking status: Former Smoker    Packs/day: 0.10    Years: 3.00  Pack years: 0.30    Types: Cigarettes  . Smokeless tobacco: Never Used  Substance Use Topics  . Alcohol use: Yes    Comment: social, not while pregnant  . Drug use: No     Allergies   Patient has no known allergies.   Review of Systems Review of Systems  All other systems reviewed and are negative.    Physical Exam Updated Vital Signs BP 110/72 (BP Location: Left Arm)   Pulse 62   Temp 98 F (36.7 C) (Oral)   Resp 19   SpO2 100%   Physical Exam  Nursing note and vitals reviewed.  27 year old female, in obvious pain, but in no acute distress. Vital signs are significant for fever. Oxygen saturation is 100%, which is normal. Head is normocephalic and atraumatic. PERRLA, EOMI. Oropharynx  is clear. Neck is nontender and supple without adenopathy or JVD. Back is nontender and there is no CVA tenderness. Lungs are clear without rales, wheezes, or rhonchi. Chest is nontender. Heart has regular rate and rhythm without murmur. Abdomen is soft, flat, with marked tenderness in the epigastrium and right upper quadrant.  There is no rebound or guarding.  There are no masses or hepatosplenomegaly and peristalsis is hypoactive. Extremities have no cyanosis or edema, full range of motion is present. Skin is warm and dry without rash. Neurologic: Mental status is normal, cranial nerves are intact, there are no motor or sensory deficits.  ED Treatments / Results  Labs (all labs ordered are listed, but only abnormal results are displayed) Labs Reviewed  COMPREHENSIVE METABOLIC PANEL - Abnormal; Notable for the following components:      Result Value   Potassium 3.4 (*)    CO2 20 (*)    Glucose, Bld 132 (*)    AST 105 (*)    ALT 121 (*)    All other components within normal limits  CBC - Abnormal; Notable for the following components:   WBC 18.3 (*)    All other components within normal limits  URINALYSIS, ROUTINE W REFLEX MICROSCOPIC - Abnormal; Notable for the following components:   APPearance HAZY (*)    Hgb urine dipstick LARGE (*)    Ketones, ur 80 (*)    Protein, ur 100 (*)    Leukocytes, UA TRACE (*)    Bacteria, UA RARE (*)    All other components within normal limits  DIFFERENTIAL - Abnormal; Notable for the following components:   Neutro Abs 15.1 (*)    Monocytes Absolute 1.3 (*)    All other components within normal limits  CULTURE, BLOOD (ROUTINE X 2)  CULTURE, BLOOD (ROUTINE X 2)  LIPASE, BLOOD  PREGNANCY, URINE  I-STAT CG4 LACTIC ACID, ED  I-STAT CG4 LACTIC ACID, ED   Radiology US Abdomen Limited  Result Date: 12/01/2017 CLINICAL DATA:  Right upper quadrant abdomen pain since 12 a.m. with nausea and vomiting EXAM: ULTRASOUND ABDOMEN LIMITED RIGHT UPPER  QUADRANT COMPARISON:  None. FINDINGS: Gallbladder: Sludge and gallstones are identified in the gallbladder. No wall thickening visualized. No sonographic Murphy sign noted by sonographer. Common bile duct: Diameter: 8.2 mm Liver: There is a 1.1 x 1.3 x 0.9 cm hyperechoic lesion in the right lobe liver. Within normal limits in parenchymal echogenicity. Portal vein is patent on color Doppler imaging with normal direction of blood flow towards the liver. IMPRESSION: Sludge and gallstones are identified in the gallbladder. There is no sonographic evidence of acute cholecystitis. The common bile duct is dilated at  8.2 mm. Choledocholithiasis is not excluded. 1.3 cm hyperechoic lesion in the right lobe liver. This is nonspecific. Differential diagnosis includes but is not limited to hemangioma. Electronically Signed   By: Sherian Rein M.D.   On: 12/01/2017 09:25    Procedures Procedures   Medications Ordered in ED Medications  ondansetron (ZOFRAN-ODT) disintegrating tablet 4 mg (has no administration in time range)  sodium chloride 0.9 % bolus 1,000 mL (has no administration in time range)  morphine 4 MG/ML injection 4 mg (has no administration in time range)  ondansetron (ZOFRAN) injection 4 mg (has no administration in time range)  cefTRIAXone (ROCEPHIN) 2 g in sodium chloride 0.9 % 100 mL IVPB (has no administration in time range)     Initial Impression / Assessment and Plan / ED Course  I have reviewed the triage vital signs and the nursing notes.  Pertinent labs & imaging results that were available during my care of the patient were reviewed by me and considered in my medical decision making (see chart for details).  Acute cholecystitis.  Old records are reviewed confirming ED visit yesterday with ultrasound showing sludge in the gallbladder in addition to gallstones and dilated common bile duct.  WBC has increased significantly to 18.3.  Conference of metabolic panel is pending-yesterday had  shown hypokalemia and she did get a dose of intravenous potassium.  Transaminases and bilirubin were normal yesterday.  She is given a dose of ceftriaxone and will need to be admitted.  She will need general surgery consultation and probable GI consultation for possible ERCP.  1:02 AM Conference of metabolic panel now shows mild to moderate elevation of AST and ALT.  This is definitely worrisome for choledocholithiasis.  Case has been discussed with Dr. Selena Batten, of [facility], who agrees to admit the patient.  2:14 AM Lactic acid level is come back normal.  I have discussed the case with Dr. Carolynne Edouard of general surgery service who agrees to see the patient in consultation.  Final Clinical Impressions(s) / ED Diagnoses   Final diagnoses:  Calculus of gallbladder with acute cholecystitis and obstruction  Elevated transaminase level    ED Discharge Orders    None       Dione Booze, MD 12/02/17 [phone]

## 2017-12-02 NOTE — ED Notes (Signed)
Patient has first set of cultures and a gold top in the main lab

## 2017-12-02 NOTE — Consult Note (Signed)
Chief Complaint:  Recurrent attacks of right upper quadrant pain after having children  History of Present Illness:  Shelby Nichols is an 27 y.o. female who is gravida 7 and began having pains after having children beginning at at 20.  She has been told that she has gallstones and that they would need to come out.  She presents with upper abdominal pain radiating into her back.    Past Medical History:  Diagnosis Date  . Chlamydia   . Trichomonas 04/25/2011    Past Surgical History:  Procedure Laterality Date  . NO PAST SURGERIES    . TUBAL LIGATION Bilateral 07/22/2017   Procedure: POST PARTUM TUBAL LIGATION;  Surgeon: Lavonia Drafts, MD;  Location: [facility];  Service: Gynecology;  Laterality: Bilateral;  . VAGINAL DELIVERY      Current Facility-Administered Medications  Medication Dose Route Frequency Provider Last Rate Last Dose  . 0.9 % NaCl with KCl 20 mEq/ L  infusion   Intravenous Continuous Jani Gravel, MD 75 mL/hr at 12/02/17 0352    . HYDROmorphone (DILAUDID) injection 1 mg  1 mg Intravenous Q3H PRN Jani Gravel, MD   1 mg at 12/02/17 0819  . ondansetron (ZOFRAN) injection 4 mg  4 mg Intravenous Q6H PRN Jani Gravel, MD   4 mg at 12/02/17 0507  . pantoprazole (PROTONIX) 80 mg in sodium chloride 0.9 % 250 mL (0.32 mg/mL) infusion  8 mg/hr Intravenous Continuous Jani Gravel, MD 25 mL/hr at 12/02/17 0352 8 mg/hr at 12/02/17 0352  . piperacillin-tazobactam (ZOSYN) IVPB 3.375 g  3.375 g Intravenous Q8H Jani Gravel, MD       Patient has no known allergies. Family History  Problem Relation Age of Onset  . Hypertension Mother   . Cancer Neg Hx   . Diabetes Neg Hx    Social History:   reports that she has quit smoking. Her smoking use included cigarettes. She has a 0.30 pack-year smoking history. She has never used smokeless tobacco. She reports that she drinks alcohol. She reports that she does not use drugs.   REVIEW OF SYSTEMS : Negative except for see problem  list  Physical Exam:   Blood pressure 113/80, pulse 87, temperature 98.8 F (37.1 C), temperature source Oral, resp. rate 18, last menstrual period 12/02/2017, SpO2 100 %, not currently breastfeeding. There is no height or weight on file to calculate BMI.  Gen:  WDWN AAF NAD  Neurological: Alert and oriented to person, place, and time. Motor and sensory function is grossly intact  Head: Normocephalic and atraumatic.  Eyes: Conjunctivae are normal. Pupils are equal, round, and reactive to light. No scleral icterus.  Neck: Normal range of motion. Neck supple. No tracheal deviation or thyromegaly present.  Cardiovascular:  SR without murmurs or gallops.  No carotid bruits Breast:  Not examined Respiratory: Effort normal.  No respiratory distress. No chest wall tenderness. Breath sounds normal.  No wheezes, rales or rhonchi.  Abdomen:  Pain in midepigastrium radiating into the back GU:  Not examined Musculoskeletal: Normal range of motion. Extremities are nontender. No cyanosis, edema or clubbing noted Lymphadenopathy: No cervical, preauricular, postauricular or axillary adenopathy is present Skin: Skin is warm and dry. No rash noted. No diaphoresis. No erythema. No pallor. Pscyh: Normal mood and affect. Behavior is normal. Judgment and thought content normal.   LABORATORY RESULTS: Results for orders placed or performed during the hospital encounter of 12/02/17 (from the past 48 hour(s))  Urinalysis, Routine w reflex microscopic  Status: Abnormal   Collection Time: 12/01/17 11:52 PM  Result Value Ref Range   Color, Urine YELLOW YELLOW   APPearance HAZY (A) CLEAR   Specific Gravity, Urine 1.028 1.005 - 1.030   pH 5.0 5.0 - 8.0   Glucose, UA NEGATIVE NEGATIVE mg/dL   Hgb urine dipstick LARGE (A) NEGATIVE   Bilirubin Urine NEGATIVE NEGATIVE   Ketones, ur 80 (A) NEGATIVE mg/dL   Protein, ur 100 (A) NEGATIVE mg/dL   Nitrite NEGATIVE NEGATIVE   Leukocytes, UA TRACE (A) NEGATIVE   RBC /  HPF 21-50 0 - 5 RBC/hpf   WBC, UA 0-5 0 - 5 WBC/hpf   Bacteria, UA RARE (A) NONE SEEN   Squamous Epithelial / LPF 0-5 0 - 5   Mucus PRESENT     Comment: Performed at [facility], [place] [address] [place], [place] [address]  Pregnancy, urine     Status: None   Collection Time: 12/01/17 11:52 PM  Result Value Ref Range   Preg Test, Ur NEGATIVE NEGATIVE    Comment:        THE SENSITIVITY OF THIS METHODOLOGY IS >20 mIU/mL. Performed at [facility], [place] [address], [place], [place] [address]   Lipase, blood     Status: None   Collection Time: 12/02/17 12:23 AM  Result Value Ref Range   Lipase 22 11 - 51 U/L    Comment: Performed at [facility], [place] [address], [place], [place] [address]  Comprehensive metabolic panel     Status: Abnormal   Collection Time: 12/02/17 12:23 AM  Result Value Ref Range   Sodium 140 135 - 145 mmol/L   Potassium 3.4 (L) 3.5 - 5.1 mmol/L   Chloride 106 101 - 111 mmol/L   CO2 20 (L) 22 - 32 mmol/L   Glucose, Bld 132 (H) 65 - 99 mg/dL   BUN 6 6 - 20 mg/dL   Creatinine, Ser 0.76 0.44 - 1.00 mg/dL   Calcium 9.5 8.9 - 10.3 mg/dL   Total Protein 8.1 6.5 - 8.1 g/dL   Albumin 4.3 3.5 - 5.0 g/dL   AST 105 (H) 15 - 41 U/L   ALT 121 (H) 14 - 54 U/L   Alkaline Phosphatase 93 38 - 126 U/L   Total Bilirubin 1.0 0.3 - 1.2 mg/dL   GFR calc non Af Amer >60 >60 mL/min   GFR calc Af Amer >60 >60 mL/min    Comment: (NOTE) The eGFR has been calculated using the CKD EPI equation. This calculation has not been validated in all clinical situations. eGFR's persistently <60 mL/min signify possible Chronic Kidney Disease.    Anion gap 14 5 - 15    Comment: Performed at [facility], [place] [address] [place], [place] [address]  CBC     Status: Abnormal   Collection Time: 12/02/17 12:23 AM  Result Value Ref Range   WBC 18.3 (H) 4.0 - 10.5 K/uL   RBC 4.59 3.87 - 5.11 MIL/uL   Hemoglobin 13.7 12.0 -  15.0 g/dL   HCT 40.7 36.0 - 46.0 %   MCV 88.7 78.0 - 100.0 fL   MCH 29.8 26.0 - 34.0 pg   MCHC 33.7 30.0 - 36.0 g/dL   RDW 12.7 11.5 - 15.5 %   Platelets 326 150 - 400 K/uL    Comment: Performed at [facility], [place] [address] [place], [place] [address]  Differential     Status: Abnormal  Collection Time: 12/02/17 12:56 AM  Result Value Ref Range   Neutrophils Relative % 85 %   Neutro Abs 15.1 (H) 1.7 - 7.7 K/uL   Lymphocytes Relative 8 %   Lymphs Abs 1.3 0.7 - 4.0 K/uL   Monocytes Relative 7 %   Monocytes Absolute 1.3 (H) 0.1 - 1.0 K/uL   Eosinophils Relative 0 %   Eosinophils Absolute 0.0 0.0 - 0.7 K/uL   Basophils Relative 0 %   Basophils Absolute 0.0 0.0 - 0.1 K/uL    Comment: Performed at [facility], [place] [address], [place], [place] [address]  I-Stat CG4 Lactic Acid, ED     Status: None   Collection Time: 12/02/17  1:47 AM  Result Value Ref Range   Lactic Acid, Venous 1.42 0.5 - 1.9 mmol/L  CBC     Status: Abnormal   Collection Time: 12/02/17  6:42 AM  Result Value Ref Range   WBC 18.6 (H) 4.0 - 10.5 K/uL   RBC 4.21 3.87 - 5.11 MIL/uL   Hemoglobin 12.4 12.0 - 15.0 g/dL   HCT 38.1 36.0 - 46.0 %   MCV 90.5 78.0 - 100.0 fL   MCH 29.5 26.0 - 34.0 pg   MCHC 32.5 30.0 - 36.0 g/dL   RDW 12.9 11.5 - 15.5 %   Platelets 268 150 - 400 K/uL    Comment: Performed at [facility], [place] [address] [place], [place] [address]  Comprehensive metabolic panel     Status: Abnormal   Collection Time: 12/02/17  6:42 AM  Result Value Ref Range   Sodium 139 135 - 145 mmol/L   Potassium 3.5 3.5 - 5.1 mmol/L   Chloride 105 101 - 111 mmol/L   CO2 21 (L) 22 - 32 mmol/L   Glucose, Bld 115 (H) 65 - 99 mg/dL   BUN 5 (L) 6 - 20 mg/dL   Creatinine, Ser 0.65 0.44 - 1.00 mg/dL   Calcium 8.2 (L) 8.9 - 10.3 mg/dL   Total Protein 6.5 6.5 - 8.1 g/dL   Albumin 3.4 (L) 3.5 - 5.0 g/dL   AST 80 (H) 15 - 41 U/L   ALT 101 (H) 14 - 54 U/L    Alkaline Phosphatase 76 38 - 126 U/L   Total Bilirubin 1.4 (H) 0.3 - 1.2 mg/dL   GFR calc non Af Amer >60 >60 mL/min   GFR calc Af Amer >60 >60 mL/min    Comment: (NOTE) The eGFR has been calculated using the CKD EPI equation. This calculation has not been validated in all clinical situations. eGFR's persistently <60 mL/min signify possible Chronic Kidney Disease.    Anion gap 13 5 - 15    Comment: Performed at [facility], [place] [address], [place], [place] [address]     RADIOLOGY RESULTS: Dg Chest 2 View  Result Date: 12/02/2017 CLINICAL DATA:  27 year old female with fever. EXAM: CHEST - 2 VIEW COMPARISON:  None. FINDINGS: The heart size and mediastinal contours are within normal limits. Both lungs are clear. The visualized skeletal structures are unremarkable. IMPRESSION: No active cardiopulmonary disease. Electronically Signed   By: Anner Crete M.D.   On: 12/02/2017 03:20   US Abdomen Limited  Result Date: 12/01/2017 CLINICAL DATA:  Right upper quadrant abdomen pain since 12 a.m. with nausea and vomiting EXAM: ULTRASOUND ABDOMEN LIMITED RIGHT UPPER QUADRANT COMPARISON:  None. FINDINGS: Gallbladder: Sludge and gallstones are identified in the gallbladder. No wall thickening visualized. No sonographic Murphy sign  noted by sonographer. Common bile duct: Diameter: 8.2 mm Liver: There is a 1.1 x 1.3 x 0.9 cm hyperechoic lesion in the right lobe liver. Within normal limits in parenchymal echogenicity. Portal vein is patent on color Doppler imaging with normal direction of blood flow towards the liver. IMPRESSION: Sludge and gallstones are identified in the gallbladder. There is no sonographic evidence of acute cholecystitis. The common bile duct is dilated at 8.2 mm. Choledocholithiasis is not excluded. 1.3 cm hyperechoic lesion in the right lobe liver. This is nonspecific. Differential diagnosis includes but is not limited to hemangioma. Electronically Signed   By:  Abelardo Diesel M.D.   On: 12/01/2017 09:25    Problem List: Patient Active Problem List   Diagnosis Date Noted  . Cholecystitis 12/02/2017  . Abdominal pain 12/02/2017  . Elevated transaminase level   . Encounter for sterilization 07/22/2017  . Preterm premature rupture of membranes 07/21/2017  . SVD (spontaneous vaginal delivery) 07/21/2017  . Supervision of normal pregnancy, antepartum 05/18/2017  . NVD (normal vaginal delivery) 10/03/2015    Assessment & Plan: Gallstones-symptomatic.  Will place on schedule for Monday.      Matt B. Hassell Done, MD, [facility] [phone] beeper [phone]  12/02/2017 8:23 AM

## 2017-12-02 NOTE — Progress Notes (Signed)
Pharmacy Antibiotic Note  Shelby Nichols is a 27 y.o. female admitted on 12/02/2017 with Intra-abdominal infection.  Pharmacy has been consulted for zosyn dosing.  Plan: Zosyn 3.375g IV q8h (4 hour infusion).     Temp (24hrs), Avg:99.5 F (37.5 C), Min:98 F (36.7 C), Max:102.1 F (38.9 C)  Recent Labs  Lab 12/01/17 0457 12/02/17 0023 12/02/17 0147  WBC 9.1 18.3*  --   CREATININE 0.80 0.76  --   LATICACIDVEN  --   --  1.42    CrCl cannot be calculated (Unknown ideal weight.).    No Known Allergies  Antimicrobials this admission: Zosyn 12/02/2017 >>   Dose adjustments this admission: -  Microbiology results: -  Thank you for allowing pharmacy to be a part of this patient's care.  Aleene Davidson Crowford 12/02/2017 2:54 AM

## 2017-12-02 NOTE — Progress Notes (Signed)
Pt received via stretcher from ER. A & O. Responds appropriately to questions asked. Medicated x 1 for c/o pain. VSS. Oriented to room.Medicated upon requested for pain.

## 2017-12-02 NOTE — Progress Notes (Signed)
  PROGRESS NOTE  Patient admitted earlier this morning. See H&P. Shelby Nichols is a 27 yo female with no previous medical history, and is 4-1/2 months postpartum, who presents with right upper quadrant abdominal pain.  She was previously seen in the emergency department, diagnosed with cholelithiasis, discharged home with pain medication and antiemetic.  Since being discharged from the emergency department, she has had significant and persistent right upper quadrant abdominal pain, radiating to the back, with ongoing nausea and vomiting.  She states that she vomited at least 5 times which was bilious in nature.  She also noticed some blood specks mixed in with her vomitus.   General surgery was consulted on admission.  Plan for cholecystectomy 5/13 for symptomatic cholelithiasis.  Continue IV Zosyn, IV fluids.  Continue pain control, antiemetic needed.  I do not think she needs GI evaluation for her upper GI bleeding, this is likely irritation from severe vomiting over the last week.  Continue to monitor for further hematemesis.  Noralee Stain, DO [facility] [email or web address] Password [email or web address] 12/02/2017, 1:39 PM

## 2017-12-02 NOTE — H&P (Addendum)
TRH H&P   Patient Demographics:    Shelby Nichols, is a 27 y.o. female  MRN: [id number]   DOB - 06/14/1991  Admit Date - 12/02/2017  Outpatient Primary MD for the patient is Patient, No Pcp Per  Referring MD/NP/PA: Delora Fuel  Outpatient Specialists:     Patient coming from: home  Chief Complaint  Patient presents with  . Abdominal Pain      HPI:    Shelby Nichols  is a 27 y.o. female, w c/o RUQ pain for 1-2 days.  + n/v with slight hematemesis, "blood streak" from wretching.  + fever. x1-2 days.  Pt denies diarrhea, brbpr, black stool, dysuria, hematuria.   + constipation.  Pt presented to ED for evaluation.   In ED,  Na 140, K 3.4,  Bun 6, creatinine 0.76 Ast 105, Alt 121 Alk phos 93, T. Bili 1.0 Wbc 18.3, Hgb 13.7, Plt 326  Blood culture x2 pending  Lactic acid 1.42  Pt will be admitted for abnormal liver function and abdominal pani r/o cholecystitis.      Review of systems:    In addition to the HPI above,  No Fever-chills, No Headache, No changes with Vision or hearing, No problems swallowing food or Liquids, No Chest pain, Cough or Shortness of Breath, No Abdominal pain, No Nausea or Vommitting, Bowel movements are regular, No Blood in stool or Urine, No dysuria, No new skin rashes or bruises, No new joints pains-aches,  No new weakness, tingling, numbness in any extremity, No recent weight gain or loss, No polyuria, polydypsia or polyphagia, No significant Mental Stressors.  A full 10 point Review of Systems was done, except as stated above, all other Review of Systems were negative.   With Past History of the following :    Past Medical History:  Diagnosis Date  . Chlamydia   . Trichomonas 04/25/2011      Past Surgical History:  Procedure Laterality Date  . NO PAST SURGERIES    . TUBAL LIGATION Bilateral 07/22/2017   Procedure: POST  PARTUM TUBAL LIGATION;  Surgeon: Lavonia Drafts, MD;  Location: [facility];  Service: Gynecology;  Laterality: Bilateral;  . VAGINAL DELIVERY        Social History:     Social History   Tobacco Use  . Smoking status: Former Smoker    Packs/day: 0.10    Years: 3.00    Pack years: 0.30    Types: Cigarettes  . Smokeless tobacco: Never Used  Substance Use Topics  . Alcohol use: Yes    Comment: social, not while pregnant     Lives - at home  Mobility - walks by self   Family History :     Family History  Problem Relation Age of Onset  . Hypertension Mother   . Cancer Neg Hx   . Diabetes Neg Hx  Home Medications:   Prior to Admission medications   Medication Sig Start Date End Date Taking? Authorizing Provider  ibuprofen (ADVIL,MOTRIN) 600 MG tablet Take 1 tablet (600 mg total) by mouth every 6 (six) hours. Patient taking differently: Take 600 mg by mouth every 6 (six) hours as needed for headache, mild pain, moderate pain or cramping.  08/28/17  Yes Shelly Bombard, MD  ondansetron (ZOFRAN ODT) 4 MG disintegrating tablet Take 1 tablet (4 mg total) by mouth every 8 (eight) hours as needed for nausea or vomiting. 12/01/17   Khatri, Hina, PA-C  oxyCODONE (OXY IR/ROXICODONE) 5 MG immediate release tablet Take 1 tablet (5 mg total) by mouth every 4 (four) hours as needed for moderate pain. Patient not taking: Reported on 12/01/2017 07/23/17   Lavonia Drafts, MD  oxyCODONE-acetaminophen (PERCOCET/ROXICET) 5-325 MG tablet Take 1 tablet by mouth every 8 (eight) hours as needed for severe pain. 12/01/17   Khatri, Hina, PA-C  Prenat-Fe Poly-Methfol-FA-DHA (VITAFOL ULTRA) 29-0.6-0.4-200 MG CAPS Take 1 tablet by mouth daily. Patient not taking: Reported on 12/01/2017 03/07/17   Constant, Peggy, MD     Allergies:    No Known Allergies   Physical Exam:   Vitals  Blood pressure 122/83, pulse 87, temperature 98 F (36.7 C), temperature source Oral,  resp. rate (!) 26, SpO2 100 %, not currently breastfeeding.   1. General  lying in bed in NAD,    2. Normal affect and insight, Not Suicidal or Homicidal, Awake Alert, Oriented X 3.  3. No F.N deficits, ALL C.Nerves Intact, Strength 5/5 all 4 extremities, Sensation intact all 4 extremities, Plantars down going.  4. Ears and Eyes appear Normal, Conjunctivae clear, PERRLA. Moist Oral Mucosa.  5. Supple Neck, No JVD, No cervical lymphadenopathy appriciated, No Carotid Bruits.  6. Symmetrical Chest wall movement, Good air movement bilaterally, CTAB.  7. RRR, No Gallops, Rubs or Murmurs, No Parasternal Heave.  8. Positive Bowel Sounds, Abdomen Soft, slight tenderness RUQ,, No organomegaly appriciated,No rebound -guarding or rigidity.  9.  No Cyanosis, Normal Skin Turgor, No Skin Rash or Bruise.  10. Good muscle tone,  joints appear normal , no effusions, Normal ROM.  11. No Palpable Lymph Nodes in Neck or Axillae     Data Review:    CBC Recent Labs  Lab 12/01/17 0457 12/02/17 0023 12/02/17 0056  WBC 9.1 18.3*  --   HGB 13.5 13.7  --   HCT 40.5 40.7  --   PLT 294 326  --   MCV 89.6 88.7  --   MCH 29.9 29.8  --   MCHC 33.3 33.7  --   RDW 12.7 12.7  --   LYMPHSABS  --   --  1.3  MONOABS  --   --  1.3*  EOSABS  --   --  0.0  BASOSABS  --   --  0.0   ------------------------------------------------------------------------------------------------------------------  Chemistries  Recent Labs  Lab 12/01/17 0457 12/02/17 0023  NA 138 140  K 3.0* 3.4*  CL 104 106  CO2 22 20*  GLUCOSE 119* 132*  BUN 8 6  CREATININE 0.80 0.76  CALCIUM 8.9 9.5  AST 23 105*  ALT 18 121*  ALKPHOS 67 93  BILITOT 0.6 1.0   ------------------------------------------------------------------------------------------------------------------ CrCl cannot be calculated (Unknown ideal  weight.). ------------------------------------------------------------------------------------------------------------------ No results for input(s): TSH, T4TOTAL, T3FREE, THYROIDAB in the last 72 hours.  Invalid input(s): FREET3  Coagulation profile No results for input(s): INR, PROTIME in the last 168 hours. -------------------------------------------------------------------------------------------------------------------  No results for input(s): DDIMER in the last 72 hours. -------------------------------------------------------------------------------------------------------------------  Cardiac Enzymes No results for input(s): CKMB, TROPONINI, MYOGLOBIN in the last 168 hours.  Invalid input(s): CK ------------------------------------------------------------------------------------------------------------------ No results found for: BNP   ---------------------------------------------------------------------------------------------------------------  Urinalysis    Component Value Date/Time   COLORURINE YELLOW 12/01/2017 2352   APPEARANCEUR HAZY (A) 12/01/2017 2352   LABSPEC 1.028 12/01/2017 2352   PHURINE 5.0 12/01/2017 2352   GLUCOSEU NEGATIVE 12/01/2017 2352   HGBUR LARGE (A) 12/01/2017 2352   BILIRUBINUR NEGATIVE 12/01/2017 2352   KETONESUR 80 (A) 12/01/2017 2352   PROTEINUR 100 (A) 12/01/2017 2352   UROBILINOGEN 2.0 (H) 05/02/2015 1000   NITRITE NEGATIVE 12/01/2017 2352   LEUKOCYTESUR TRACE (A) 12/01/2017 2352    ----------------------------------------------------------------------------------------------------------------   Imaging Results:    US Abdomen Limited  Result Date: 12/01/2017 CLINICAL DATA:  Right upper quadrant abdomen pain since 12 a.m. with nausea and vomiting EXAM: ULTRASOUND ABDOMEN LIMITED RIGHT UPPER QUADRANT COMPARISON:  None. FINDINGS: Gallbladder: Sludge and gallstones are identified in the gallbladder. No wall thickening visualized. No  sonographic Murphy sign noted by sonographer. Common bile duct: Diameter: 8.2 mm Liver: There is a 1.1 x 1.3 x 0.9 cm hyperechoic lesion in the right lobe liver. Within normal limits in parenchymal echogenicity. Portal vein is patent on color Doppler imaging with normal direction of blood flow towards the liver. IMPRESSION: Sludge and gallstones are identified in the gallbladder. There is no sonographic evidence of acute cholecystitis. The common bile duct is dilated at 8.2 mm. Choledocholithiasis is not excluded. 1.3 cm hyperechoic lesion in the right lobe liver. This is nonspecific. Differential diagnosis includes but is not limited to hemangioma. Electronically Signed   By: Abelardo Diesel M.D.   On: 12/01/2017 09:25     Assessment & Plan:    Active Problems:   Cholecystitis   Abdominal pain    RUQ pain CXR NPO except for medications Dilaudid '1mg'$  iv q3h prn protonix iv Surgery consult called by ED, appreciate input   N/v  ? Hematemesis? Mallory weiss protonix iv zofran '4mg'$  iv q6h prn Please consult GI in am Check cbc in am  Fever CXR Urinalysis pending Blood culture x2 pending Zosyn iv pharmacy to dose  Hypokalemia Replete  Check cmp in am  DVT Prophylaxis SCDs   AM Labs Ordered, also please review Full Orders  Family Communication: Admission, patients condition and plan of care including tests being ordered have been discussed with the patient  who indicate understanding and agree with the plan and Code Status.  Code Status FULL CODE  Likely DC to  home  Condition GUARDED    Consults called: surgery b y ED,consider GI consultation in am  Admission status: inpatient  Time spent in minutes : 45   Jani Gravel M.D on 12/02/2017 at 2:42 AM  Between 7am to 7pm - Pager - [phone]  After 7pm go to [email or web address] - password [facility]  [facility] - Office  [phone]

## 2017-12-03 ENCOUNTER — Encounter (HOSPITAL_COMMUNITY): Payer: Self-pay | Admitting: Anesthesiology

## 2017-12-03 ENCOUNTER — Inpatient Hospital Stay (HOSPITAL_COMMUNITY): Payer: Medicaid Other | Admitting: Anesthesiology

## 2017-12-03 ENCOUNTER — Encounter (HOSPITAL_COMMUNITY)
Admission: EM | Disposition: A | Discharge status: Home / Self Care | Payer: Self-pay | Source: Home / Self Care | Attending: Internal Medicine

## 2017-12-03 ENCOUNTER — Inpatient Hospital Stay (HOSPITAL_COMMUNITY): Payer: Medicaid Other

## 2017-12-03 DIAGNOSIS — K819 Cholecystitis, unspecified: Secondary | ICD-10-CM

## 2017-12-03 HISTORY — PX: CHOLECYSTECTOMY: SHX55

## 2017-12-03 LAB — COMPREHENSIVE METABOLIC PANEL
ALBUMIN: 2.6 g/dL — AB (ref 3.5–5.0)
ALK PHOS: 62 U/L (ref 38–126)
ALT: 77 U/L — ABNORMAL HIGH (ref 14–54)
AST: 40 U/L (ref 15–41)
Anion gap: 9 (ref 5–15)
BILIRUBIN TOTAL: 1.7 mg/dL — AB (ref 0.3–1.2)
CO2: 23 mmol/L (ref 22–32)
Calcium: 7.4 mg/dL — ABNORMAL LOW (ref 8.9–10.3)
Chloride: 105 mmol/L (ref 101–111)
Creatinine, Ser: 0.74 mg/dL (ref 0.44–1.00)
GFR calc Af Amer: >60 mL/min (ref >60)
GFR calc non Af Amer: >60 mL/min (ref >60)
GLUCOSE: 105 mg/dL — AB (ref 65–99)
Potassium: 3.3 mmol/L — ABNORMAL LOW (ref 3.5–5.1)
SODIUM: 137 mmol/L (ref 135–145)
TOTAL PROTEIN: 5.3 g/dL — AB (ref 6.5–8.1)

## 2017-12-03 LAB — HEPATITIS PANEL, ACUTE
HEP B C IGM: NEGATIVE
Hep A IgM: NEGATIVE
Hepatitis B Surface Ag: NEGATIVE

## 2017-12-03 LAB — CBC
HCT: 32 % — ABNORMAL LOW (ref 36.0–46.0)
Hemoglobin: 10.5 g/dL — ABNORMAL LOW (ref 12.0–15.0)
MCH: 30.1 pg (ref 26.0–34.0)
MCHC: 32.8 g/dL (ref 30.0–36.0)
MCV: 91.7 fL (ref 78.0–100.0)
Platelets: 204 10*3/uL (ref 150–400)
RBC: 3.49 MIL/uL — ABNORMAL LOW (ref 3.87–5.11)
RDW: 13.1 % (ref 11.5–15.5)
WBC: 18.5 10*3/uL — ABNORMAL HIGH (ref 4.0–10.5)

## 2017-12-03 LAB — LIPASE, BLOOD: Lipase: 19 U/L (ref 11–51)

## 2017-12-03 SURGERY — LAPAROSCOPIC CHOLECYSTECTOMY WITH INTRAOPERATIVE CHOLANGIOGRAM
Anesthesia: General

## 2017-12-03 MED ORDER — MEPERIDINE HCL 50 MG/ML IJ SOLN: 6.2500 mg | INTRAMUSCULAR | Status: DC | PRN

## 2017-12-03 MED ORDER — LIDOCAINE 2% (20 MG/ML) 5 ML SYRINGE
INTRAMUSCULAR | Status: DC | PRN
  Administered 2017-12-03: 100 mg via INTRAVENOUS

## 2017-12-03 MED ORDER — FENTANYL CITRATE (PF) 100 MCG/2ML IJ SOLN
INTRAMUSCULAR | Status: AC
  Filled 2017-12-03: qty 2

## 2017-12-03 MED ORDER — HYDROMORPHONE HCL 1 MG/ML IJ SOLN
INTRAMUSCULAR | Status: AC
  Filled 2017-12-03: qty 2

## 2017-12-03 MED ORDER — FENTANYL CITRATE (PF) 100 MCG/2ML IJ SOLN
50.0000 ug | INTRAMUSCULAR | Status: DC | PRN
  Administered 2017-12-03 (×2): 50 ug via INTRAVENOUS

## 2017-12-03 MED ORDER — SUGAMMADEX SODIUM 200 MG/2ML IV SOLN
INTRAVENOUS | Status: DC | PRN
  Administered 2017-12-03: 200 mg via INTRAVENOUS

## 2017-12-03 MED ORDER — ROCURONIUM BROMIDE 10 MG/ML (PF) SYRINGE
PREFILLED_SYRINGE | INTRAVENOUS | Status: DC | PRN
  Administered 2017-12-03: 10 mg via INTRAVENOUS
  Administered 2017-12-03: 5 mg via INTRAVENOUS
  Administered 2017-12-03: 40 mg via INTRAVENOUS

## 2017-12-03 MED ORDER — SUGAMMADEX SODIUM 200 MG/2ML IV SOLN
INTRAVENOUS | Status: AC
  Filled 2017-12-03: qty 2

## 2017-12-03 MED ORDER — PROMETHAZINE HCL 25 MG/ML IJ SOLN: 6.2500 mg | INTRAMUSCULAR | Status: DC | PRN

## 2017-12-03 MED ORDER — PROPOFOL 10 MG/ML IV BOLUS
INTRAVENOUS | Status: DC | PRN
  Administered 2017-12-03: 160 mg via INTRAVENOUS

## 2017-12-03 MED ORDER — DEXAMETHASONE SODIUM PHOSPHATE 10 MG/ML IJ SOLN
INTRAMUSCULAR | Status: DC | PRN
  Administered 2017-12-03: 10 mg via INTRAVENOUS

## 2017-12-03 MED ORDER — FENTANYL CITRATE (PF) 100 MCG/2ML IJ SOLN
INTRAMUSCULAR | Status: DC | PRN
  Administered 2017-12-03 (×4): 50 ug via INTRAVENOUS

## 2017-12-03 MED ORDER — KETOROLAC TROMETHAMINE 30 MG/ML IJ SOLN
30.0000 mg | Freq: Three times a day (TID) | INTRAMUSCULAR | Status: DC
  Administered 2017-12-03 – 2017-12-04 (×3): 30 mg via INTRAVENOUS
  Filled 2017-12-03 (×3): qty 1

## 2017-12-03 MED ORDER — ACETAMINOPHEN 500 MG PO TABS: 1000.0000 mg | ORAL_TABLET | Freq: Three times a day (TID) | ORAL | Status: DC

## 2017-12-03 MED ORDER — PROPOFOL 10 MG/ML IV BOLUS
INTRAVENOUS | Status: AC
  Filled 2017-12-03: qty 20

## 2017-12-03 MED ORDER — CEFAZOLIN SODIUM-DEXTROSE 2-3 GM-%(50ML) IV SOLR
INTRAVENOUS | Status: DC | PRN
  Administered 2017-12-03: 2 g via INTRAVENOUS

## 2017-12-03 MED ORDER — OXYCODONE HCL 5 MG PO TABS: 5.0000 mg | ORAL_TABLET | Freq: Once | ORAL | Status: DC | PRN

## 2017-12-03 MED ORDER — BUPIVACAINE-EPINEPHRINE (PF) 0.5% -1:200000 IJ SOLN
INTRAMUSCULAR | Status: DC | PRN
  Administered 2017-12-03: 30 mL

## 2017-12-03 MED ORDER — POTASSIUM CHLORIDE CRYS ER 20 MEQ PO TBCR
40.0000 meq | EXTENDED_RELEASE_TABLET | Freq: Once | ORAL | Status: AC
  Administered 2017-12-03: 40 meq via ORAL
  Filled 2017-12-03: qty 2

## 2017-12-03 MED ORDER — LACTATED RINGERS IV SOLN
INTRAVENOUS | Status: DC | PRN
  Administered 2017-12-03: 11:00:00 via INTRAVENOUS

## 2017-12-03 MED ORDER — BUPIVACAINE-EPINEPHRINE (PF) 0.5% -1:200000 IJ SOLN
INTRAMUSCULAR | Status: AC
  Filled 2017-12-03: qty 30

## 2017-12-03 MED ORDER — 0.9 % SODIUM CHLORIDE (POUR BTL) OPTIME
TOPICAL | Status: DC | PRN
  Administered 2017-12-03: 1000 mL

## 2017-12-03 MED ORDER — CEFAZOLIN SODIUM-DEXTROSE 2-4 GM/100ML-% IV SOLN
INTRAVENOUS | Status: AC
  Filled 2017-12-03: qty 100

## 2017-12-03 MED ORDER — SCOPOLAMINE 1 MG/3DAYS TD PT72
MEDICATED_PATCH | TRANSDERMAL | Status: AC
  Filled 2017-12-03: qty 1

## 2017-12-03 MED ORDER — OXYCODONE HCL 5 MG PO TABS: 5.0000 mg | ORAL_TABLET | ORAL | Status: DC | PRN

## 2017-12-03 MED ORDER — IOPAMIDOL (ISOVUE-300) INJECTION 61%
INTRAVENOUS | Status: AC
  Filled 2017-12-03: qty 50

## 2017-12-03 MED ORDER — ACETAMINOPHEN 325 MG PO TABS
650.0000 mg | ORAL_TABLET | Freq: Three times a day (TID) | ORAL | Status: DC
  Administered 2017-12-03 – 2017-12-04 (×3): 650 mg via ORAL
  Filled 2017-12-03 (×3): qty 2

## 2017-12-03 MED ORDER — HYDROMORPHONE HCL 1 MG/ML IJ SOLN
0.2500 mg | INTRAMUSCULAR | Status: DC | PRN
Start: 2017-12-03 — End: 2017-12-03
  Administered 2017-12-03 (×4): 0.5 mg via INTRAVENOUS

## 2017-12-03 MED ORDER — SCOPOLAMINE 1 MG/3DAYS TD PT72
MEDICATED_PATCH | TRANSDERMAL | Status: DC | PRN
  Administered 2017-12-03: 1 via TRANSDERMAL

## 2017-12-03 MED ORDER — MIDAZOLAM HCL 5 MG/5ML IJ SOLN
INTRAMUSCULAR | Status: DC | PRN
  Administered 2017-12-03: 1 mg via INTRAVENOUS

## 2017-12-03 MED ORDER — KCL IN DEXTROSE-NACL 20-5-0.45 MEQ/L-%-% IV SOLN
INTRAVENOUS | Status: DC
  Administered 2017-12-03 – 2017-12-04 (×2): via INTRAVENOUS
  Filled 2017-12-03 (×2): qty 1000

## 2017-12-03 MED ORDER — LACTATED RINGERS IV SOLN
INTRAVENOUS | Status: DC
  Administered 2017-12-03: 15:00:00 via INTRAVENOUS
  Administered 2017-12-03: 1000 mL via INTRAVENOUS

## 2017-12-03 MED ORDER — LACTATED RINGERS IR SOLN
Status: DC | PRN
  Administered 2017-12-03: 2000 mL

## 2017-12-03 MED ORDER — OXYCODONE HCL 5 MG/5ML PO SOLN
5.0000 mg | Freq: Once | ORAL | Status: DC | PRN
  Filled 2017-12-03: qty 5

## 2017-12-03 MED ORDER — SUGAMMADEX SODIUM 500 MG/5ML IV SOLN
INTRAVENOUS | Status: AC
  Filled 2017-12-03: qty 5

## 2017-12-03 MED ORDER — MIDAZOLAM HCL 2 MG/2ML IJ SOLN
INTRAMUSCULAR | Status: AC
  Filled 2017-12-03: qty 2

## 2017-12-03 MED ORDER — IOPAMIDOL (ISOVUE-300) INJECTION 61%
INTRAVENOUS | Status: DC | PRN
  Administered 2017-12-03: 5 mL

## 2017-12-03 SURGICAL SUPPLY — 48 items
ADH SKN CLS APL DERMABOND .7 (GAUZE/BANDAGES/DRESSINGS) ×1
APL SKNCLS STERI-STRIP NONHPOA (GAUZE/BANDAGES/DRESSINGS)
APPLIER CLIP 5 13 M/L LIGAMAX5 (MISCELLANEOUS)
APPLIER CLIP ROT 10 11.4 M/L (STAPLE) ×3
APR CLP MED LRG 11.4X10 (STAPLE) ×1
APR CLP MED LRG 5 ANG JAW (MISCELLANEOUS)
BAG SPEC RTRVL 10 TROC 200 (ENDOMECHANICALS) ×1
BENZOIN TINCTURE PRP APPL 2/3 (GAUZE/BANDAGES/DRESSINGS) IMPLANT
CABLE HIGH FREQUENCY MONO STRZ (ELECTRODE) ×3 IMPLANT
CHLORAPREP W/TINT 26ML (MISCELLANEOUS) ×3 IMPLANT
CHOLANGIOGRAM CATH TAUT (CATHETERS) ×3 IMPLANT
CLIP APPLIE 5 13 M/L LIGAMAX5 (MISCELLANEOUS) IMPLANT
CLIP APPLIE ROT 10 11.4 M/L (STAPLE) IMPLANT
CLOSURE WOUND 1/4X4 (GAUZE/BANDAGES/DRESSINGS)
COVER MAYO STAND STRL (DRAPES) ×3 IMPLANT
COVER SURGICAL LIGHT HANDLE (MISCELLANEOUS) ×3 IMPLANT
DECANTER SPIKE VIAL GLASS SM (MISCELLANEOUS) ×3 IMPLANT
DERMABOND ADVANCED (GAUZE/BANDAGES/DRESSINGS) ×2
DERMABOND ADVANCED .7 DNX12 (GAUZE/BANDAGES/DRESSINGS) IMPLANT
DRAPE C-ARM 42X120 X-RAY (DRAPES) ×3 IMPLANT
ELECT REM PT RETURN 15FT ADLT (MISCELLANEOUS) ×3 IMPLANT
GLOVE BIOGEL PI IND STRL 6.5 (GLOVE) IMPLANT
GLOVE BIOGEL PI IND STRL 7.0 (GLOVE) IMPLANT
GLOVE BIOGEL PI INDICATOR 6.5 (GLOVE) ×4
GLOVE BIOGEL PI INDICATOR 7.0 (GLOVE) ×4
GLOVE INDICATOR 7.5 STRL GRN (GLOVE) ×2 IMPLANT
GLOVE SURG SIGNA 7.5 PF LTX (GLOVE) ×5 IMPLANT
GOWN STRL REUS W/TWL XL LVL3 (GOWN DISPOSABLE) ×11 IMPLANT
HEMOSTAT SURGICEL 4X8 (HEMOSTASIS) IMPLANT
IV CATH 14GX2 1/4 (CATHETERS) ×3 IMPLANT
IV SET EXTENSION CATH 6 NF (IV SETS) ×3 IMPLANT
KIT BASIN OR (CUSTOM PROCEDURE TRAY) ×3 IMPLANT
POUCH RETRIEVAL ECOSAC 10 (ENDOMECHANICALS) ×1 IMPLANT
POUCH RETRIEVAL ECOSAC 10MM (ENDOMECHANICALS) ×2
SCISSORS LAP 5X35 DISP (ENDOMECHANICALS) ×3 IMPLANT
SET IRRIG TUBING LAPAROSCOPIC (IRRIGATION / IRRIGATOR) ×3 IMPLANT
SLEEVE ADV FIXATION 5X100MM (TROCAR) ×3 IMPLANT
STOPCOCK 4 WAY LG BORE MALE ST (IV SETS) ×3 IMPLANT
STRIP CLOSURE SKIN 1/4X4 (GAUZE/BANDAGES/DRESSINGS) IMPLANT
SUT MNCRL AB 4-0 PS2 18 (SUTURE) ×3 IMPLANT
SYR 10ML ECCENTRIC (SYRINGE) ×3 IMPLANT
TOWEL OR 17X26 10 PK STRL BLUE (TOWEL DISPOSABLE) ×3 IMPLANT
TOWEL OR NON WOVEN STRL DISP B (DISPOSABLE) ×3 IMPLANT
TRAY LAPAROSCOPIC (CUSTOM PROCEDURE TRAY) ×3 IMPLANT
TROCAR ADV FIXATION 11X100MM (TROCAR) ×2 IMPLANT
TROCAR ADV FIXATION 5X100MM (TROCAR) ×3 IMPLANT
TROCAR XCEL BLUNT TIP 100MML (ENDOMECHANICALS) ×3 IMPLANT
TUBING INSUF HEATED (TUBING) ×3 IMPLANT

## 2017-12-03 NOTE — Plan of Care (Signed)
Plan of care discussed.   

## 2017-12-03 NOTE — Anesthesia Preprocedure Evaluation (Addendum)
Anesthesia Evaluation  Patient identified by MRN, date of birth, ID band Patient awake    Reviewed: Allergy & Precautions, NPO status , Patient's Chart, lab work & pertinent test results  Airway Mallampati: I  TM Distance: >3 FB Neck ROM: Full    Dental no notable dental hx.    Pulmonary former smoker,    Pulmonary exam normal breath sounds clear to auscultation       Cardiovascular negative cardio ROS Normal cardiovascular exam Rhythm:Regular Rate:Normal     Neuro/Psych negative neurological ROS  negative psych ROS   GI/Hepatic negative GI ROS, Neg liver ROS,   Endo/Other  negative endocrine ROS  Renal/GU negative Renal ROS     Musculoskeletal negative musculoskeletal ROS (+)   Abdominal (+) + obese,   Peds  Hematology negative hematology ROS (+)   Anesthesia Other Findings desires sterilization  Reproductive/Obstetrics negative OB ROS                             Anesthesia Physical  Anesthesia Plan  ASA: II  Anesthesia Plan: General   Post-op Pain Management:    Induction: Intravenous  PONV Risk Score and Plan: 4 or greater and Treatment may vary due to age or medical condition, Ondansetron, Dexamethasone, Midazolam and Scopolamine patch - Pre-op  Airway Management Planned: Oral ETT  Additional Equipment: None  Intra-op Plan:   Post-operative Plan: Extubation in OR  Informed Consent: I have reviewed the patients History and Physical, chart, labs and discussed the procedure including the risks, benefits and alternatives for the proposed anesthesia with the patient or authorized representative who has indicated his/her understanding and acceptance.   Dental advisory given  Plan Discussed with: CRNA  Anesthesia Plan Comments:        Anesthesia Quick Evaluation

## 2017-12-03 NOTE — Progress Notes (Addendum)
Day of Surgery    CC:  RUQ pain  Subjective: 7 mg dilaudid yesterday, 8 mg of morphine, 10 PO oxycodone, yesterday for pain Still having allot of pain RUQ.  No nausea or vomiting. Objective: Vital signs in last 24 hours: Temp:  [98.7 F (37.1 C)-102 F (38.9 C)] 98.7 F (37.1 C) (05/13 0614) Pulse Rate:  [95-115] 95 (05/13 0614) Resp:  [14-18] 14 (05/13 0614) BP: (104-110)/(59-60) 104/59 (05/13 0614) SpO2:  [96 %-99 %] 98 % (05/13 0614) Weight:  [81.5 kg (179 lb 10.8 oz)] 81.5 kg (179 lb 10.8 oz) (05/12 2013)  120 PO 3000 IV 1250 urine Tm 102; 1900 & 2100 last pm now afebrile, VSS WBC 18.5. Bil up some 1.7 [place]: Sludge and gallstones are identified in the gallbladder. There is no sonographic evidence of acute cholecystitis. The common bile duct is dilated at 8.2 mm. Choledocholithiasis is not excluded. 1.3 cm hyperechoic lesion in the right lobe liver. This is nonspecific. Differential diagnosis includes but is not limited to Hemangioma.   Intake/Output from previous day: 05/12 0701 - 05/13 0700 In: 3071.7 [P.O.:120; I.V.:2801.7; IV Piggyback:150] Out: 1250 [Urine:1250] Intake/Output this shift: No intake/output data recorded.  General appearance: alert, cooperative and no distress Resp: clear to auscultation bilaterally GI: tenter RUQ, ongoing pain  Lab Results:  Recent Labs    12/02/17 0642 12/03/17 0432  WBC 18.6* 18.5*  HGB 12.4 10.5*  HCT 38.1 32.0*  PLT 268 204    BMET Recent Labs    12/02/17 0642 12/03/17 0432  NA 139 137  K 3.5 3.3*  CL 105 105  CO2 21* 23  GLUCOSE 115* 105*  BUN 5* <5*  CREATININE 0.65 0.74  CALCIUM 8.2* 7.4*   PT/INR No results for input(s): LABPROT, INR in the last 72 hours.  Recent Labs  Lab 12/01/17 0457 12/02/17 0023 12/02/17 0642 12/03/17 0432  AST 23 105* 80* 40  ALT 18 121* 101* 77*  ALKPHOS 67 93 76 62  BILITOT 0.6 1.0 1.4* 1.7*  PROT 7.6 8.1 6.5 5.3*  ALBUMIN 4.2 4.3 3.4* 2.6*     Lipase      Component Value Date/Time   LIPASE 22 12/02/2017 0023   Prior to Admission medications   Medication Sig Start Date End Date Taking? Authorizing Provider  ibuprofen (ADVIL,MOTRIN) 600 MG tablet Take 1 tablet (600 mg total) by mouth every 6 (six) hours. Patient taking differently: Take 600 mg by mouth every 6 (six) hours as needed for headache, mild pain, moderate pain or cramping.  08/28/17  Yes Brock Bad, MD  ondansetron (ZOFRAN ODT) 4 MG disintegrating tablet Take 1 tablet (4 mg total) by mouth every 8 (eight) hours as needed for nausea or vomiting. 12/01/17   Khatri, Hina, PA-C  oxyCODONE (OXY IR/ROXICODONE) 5 MG immediate release tablet Take 1 tablet (5 mg total) by mouth every 4 (four) hours as needed for moderate pain. Patient not taking: Reported on 12/01/2017 07/23/17   Willodean Rosenthal, MD  oxyCODONE-acetaminophen (PERCOCET/ROXICET) 5-325 MG tablet Take 1 tablet by mouth every 8 (eight) hours as needed for severe pain. 12/01/17   Khatri, Hina, PA-C  Prenat-Fe Poly-Methfol-FA-DHA (VITAFOL ULTRA) 29-0.6-0.4-200 MG CAPS Take 1 tablet by mouth daily. Patient not taking: Reported on 12/01/2017 03/07/17   Constant, Peggy, MD    Medications: . heparin  5,000 Units Subcutaneous On Call to OR   . sodium chloride 100 mL/hr at 12/02/17 1431  .  ceFAZolin (ANCEF) IV    . piperacillin-tazobactam (ZOSYN)  IV 3.375 g (12/03/17 0547)   Anti-infectives (From admission, onward)   Start     Dose/Rate Route Frequency Ordered Stop   12/03/17 0600  ceFAZolin (ANCEF) IVPB 2g/100 mL premix     2 g 200 mL/hr over 30 Minutes Intravenous On call to O.R. 12/02/17 1921 12/04/17 0559   12/02/17 0300  piperacillin-tazobactam (ZOSYN) IVPB 3.375 g     3.375 g 12.5 mL/hr over 240 Minutes Intravenous Every 8 hours 12/02/17 0252     12/02/17 0100  cefTRIAXone (ROCEPHIN) 2 g in sodium chloride 0.9 % 100 mL IVPB     2 g 200 mL/hr over 30 Minutes Intravenous  Once 12/02/17 0046 12/02/17 0210     Assessment/Plan Post partum 07/23/17 G7P7  Symptomatic cholelithiasis/cholelithiasis FEN:  IV fluids/NPO ID:  Rocephin 5/12/Zosyn 5/12 =>> day 2 DVT:  Heparin Follow up[facility]  [facility]  Plan:  Add some Toradol to her Pain meds and get her away from narcotics.  OR later this AM.    LOS: 1 day    Shelby Nichols,Shelby Nichols 12/03/2017 [phone]   Agree with above.    Patient examined and chart reviewed.  Signs and symptoms consistent with cholecystitis.  For lap chole later today.   I discussed with the patient the indications and risks of gall bladder surgery.  The primary risks of gall bladder surgery include, but are not limited to, bleeding, infection, common bile duct injury, and open surgery.  There is also the risk that the patient may have continued symptoms after surgery.  We discussed the typical post-operative recovery course. I tried to answer the patient's questions.   Works as Social worker.  Boyfriend, father of all children, Addison Lank.  Ovidio Kin, MD, [facility] Surgery Pager: [phone] Office phone:  [phone]

## 2017-12-03 NOTE — Anesthesia Postprocedure Evaluation (Signed)
Anesthesia Post Note  Patient: Shelby Nichols  Procedure(s) Performed: LAPAROSCOPIC CHOLECYSTECTOMY WITH INTRAOPERATIVE CHOLANGIOGRAM (N/A )     Patient location during evaluation: PACU Anesthesia Type: General Level of consciousness: awake and alert Pain management: pain level controlled Vital Signs Assessment: post-procedure vital signs reviewed and stable Respiratory status: spontaneous breathing, nonlabored ventilation, respiratory function stable and patient connected to nasal cannula oxygen Cardiovascular status: blood pressure returned to baseline and stable Postop Assessment: no apparent nausea or vomiting Anesthetic complications: no    Last Vitals:  Vitals:   12/03/17 1445 12/03/17 1500  BP: 111/67 115/67  Pulse: 83 80  Resp: 16 11  Temp:    SpO2: 99% 98%    Last Pain:  Vitals:   12/03/17 1500  TempSrc:   PainSc: 5                  Mindie Rawdon

## 2017-12-03 NOTE — Anesthesia Procedure Notes (Signed)
Procedure Name: Intubation Date/Time: 12/03/2017 11:53 AM Performed by: Lavina Hamman, CRNA Pre-anesthesia Checklist: Patient identified, Emergency Drugs available, Suction available and Patient being monitored Patient Re-evaluated:Patient Re-evaluated prior to induction Oxygen Delivery Method: Circle system utilized Preoxygenation: Pre-oxygenation with 100% oxygen Induction Type: IV induction Ventilation: Mask ventilation without difficulty Laryngoscope Size: Mac and 3 Grade View: Grade I Tube type: Oral Tube size: 7.0 mm Number of attempts: 1 Airway Equipment and Method: Stylet Placement Confirmation: ETT inserted through vocal cords under direct vision Secured at: 21 cm Tube secured with: Tape

## 2017-12-03 NOTE — Progress Notes (Signed)
PROGRESS NOTE    Shelby Nichols  ZOX:[id number] DOB: [birth year] DOA: 12/02/2017 PCP: Patient, No Pcp Per     Brief Narrative:  Shelby Nichols is a 27 yo female with no previous medical history, and is 4-1/2 months postpartum, who presents with right upper quadrant abdominal pain.  She was previously seen in the emergency department, diagnosed with cholelithiasis, discharged home with pain medication and antiemetic.  Since being discharged from the emergency department, she has had significant and persistent right upper quadrant abdominal pain, radiating to the back, with ongoing nausea and vomiting.  She states that she vomited at least 5 times which was bilious in nature.  She also noticed some blood specks mixed in with her vomitus. General surgery was consulted on admission.  Plan for cholecystectomy 5/13.  Assessment & Plan:   Active Problems:   Cholecystitis   Abdominal pain  Symptomatic cholelithiasis -General surgery planning for lap cholecystectomy today 5/13 -Continue IV Zosyn -Trend labs  Hypokalemia -Replace, trend    DVT prophylaxis: SCD Code Status: Full Family Communication: No family at bedside Disposition Plan: Pending post-surgical course   Consultants:   General surgery  Procedures:   None   Antimicrobials:  Anti-infectives (From admission, onward)   Start     Dose/Rate Route Frequency Ordered Stop   12/03/17 1136  ceFAZolin (ANCEF) 2-4 GM/100ML-% IVPB    Note to Pharmacy:  Leroy Libman   : cabinet override      12/03/17 1136 12/03/17 2344   12/03/17 0600  ceFAZolin (ANCEF) IVPB 2g/100 mL premix  Status:  Discontinued     2 g 200 mL/hr over 30 Minutes Intravenous On call to O.R. 12/02/17 1921 12/03/17 0804   12/02/17 0300  [MAR Hold]  piperacillin-tazobactam (ZOSYN) IVPB 3.375 g     (MAR Hold since Mon 12/03/2017 at 1046. Reason: Transfer to a Procedural area.)   3.375 g 12.5 mL/hr over 240 Minutes Intravenous Every 8 hours 12/02/17 0252     12/02/17 0100  cefTRIAXone (ROCEPHIN) 2 g in sodium chloride 0.9 % 100 mL IVPB     2 g 200 mL/hr over 30 Minutes Intravenous  Once 12/02/17 0046 12/02/17 0210        Subjective: Patient has significant right upper quadrant abdominal pain today  Objective: Vitals:   12/02/17 2013 12/02/17 2159 12/03/17 0614 12/03/17 1058  BP:  105/60 (!) 104/59   Pulse:  (!) 112 95   Resp:  18 14   Temp:  (!) 102 F (38.9 C) 98.7 F (37.1 C)   TempSrc:  Oral Oral   SpO2:  96% 98%   Weight: 81.5 kg (179 lb 10.8 oz)   81.2 kg (179 lb)  Height: 5' (1.524 m)   5' (1.524 m)    Intake/Output Summary (Last 24 hours) at 12/03/2017 1254 Last data filed at 12/03/2017 0622 Gross per 24 hour  Intake 3071.66 ml  Output 1250 ml  Net 1821.66 ml   Filed Weights   12/02/17 2013 12/03/17 1058  Weight: 81.5 kg (179 lb 10.8 oz) 81.2 kg (179 lb)    Examination:  General exam: Appears calm but uncomfortable  Respiratory system: Clear to auscultation. Respiratory effort normal. Cardiovascular system: S1 & S2 heard, RRR. No JVD, murmurs, rubs, gallops or clicks. No pedal edema. Gastrointestinal system: Abdomen is nondistended, soft and TTP RUQ. No organomegaly or masses felt. Normal bowel sounds heard. Central nervous system: Alert and oriented. No focal neurological deficits. Extremities: Symmetric 5 x 5 power. Skin:  No rashes, lesions or ulcers Psychiatry: Judgement and insight appear normal. Mood & affect appropriate.   Data Reviewed: I have personally reviewed following labs and imaging studies  CBC: Recent Labs  Lab 12/01/17 0457 12/02/17 0023 12/02/17 0056 12/02/17 0642 12/03/17 0432  WBC 9.1 18.3*  --  18.6* 18.5*  NEUTROABS  --   --  15.1*  --   --   HGB 13.5 13.7  --  12.4 10.5*  HCT 40.5 40.7  --  38.1 32.0*  MCV 89.6 88.7  --  90.5 91.7  PLT 294 326  --  268 204   Basic Metabolic Panel: Recent Labs  Lab 12/01/17 0457 12/02/17 0023 12/02/17 0642 12/03/17 0432  NA 138 140 139 137   K 3.0* 3.4* 3.5 3.3*  CL 104 106 105 105  CO2 22 20* 21* 23  GLUCOSE 119* 132* 115* 105*  BUN 8 6 5* <5*  CREATININE 0.80 0.76 0.65 0.74  CALCIUM 8.9 9.5 8.2* 7.4*   GFR: Estimated Creatinine Clearance: 98.8 mL/min (by C-G formula based on SCr of 0.74 mg/dL). Liver Function Tests: Recent Labs  Lab 12/01/17 0457 12/02/17 0023 12/02/17 0642 12/03/17 0432  AST 23 105* 80* 40  ALT 18 121* 101* 77*  ALKPHOS 67 93 76 62  BILITOT 0.6 1.0 1.4* 1.7*  PROT 7.6 8.1 6.5 5.3*  ALBUMIN 4.2 4.3 3.4* 2.6*   Recent Labs  Lab 12/01/17 0457 12/02/17 0023 12/03/17 0432  LIPASE No results for input(s): AMMONIA in the last 168 hours. Coagulation Profile: No results for input(s): INR, PROTIME in the last 168 hours. Cardiac Enzymes: No results for input(s): CKTOTAL, CKMB, CKMBINDEX, TROPONINI in the last 168 hours. BNP (last 3 results) No results for input(s): PROBNP in the last 8760 hours. HbA1C: No results for input(s): HGBA1C in the last 72 hours. CBG: No results for input(s): GLUCAP in the last 168 hours. Lipid Profile: No results for input(s): CHOL, HDL, LDLCALC, TRIG, CHOLHDL, LDLDIRECT in the last 72 hours. Thyroid Function Tests: No results for input(s): TSH, T4TOTAL, FREET4, T3FREE, THYROIDAB in the last 72 hours. Anemia Panel: No results for input(s): VITAMINB12, FOLATE, FERRITIN, TIBC, IRON, RETICCTPCT in the last 72 hours. Sepsis Labs: Recent Labs  Lab 12/02/17 0147  LATICACIDVEN 1.42    Recent Results (from the past 240 hour(s))  Surgical pcr screen     Status: None   Collection Time: 12/02/17  7:59 PM  Result Value Ref Range Status   MRSA, PCR NEGATIVE NEGATIVE Final   Staphylococcus aureus NEGATIVE NEGATIVE Final    Comment: (NOTE) The Xpert SA Assay (FDA approved for NASAL specimens in patients 33 years of age and older), is one component of a comprehensive surveillance program. It is not intended to diagnose infection nor to guide or monitor  treatment. Performed at [facility], [address] [place], [place] [address]        Radiology Studies: Dg Chest 2 View  Result Date: 12/02/2017 CLINICAL DATA:  27 year old female with fever. EXAM: CHEST - 2 VIEW COMPARISON:  None. FINDINGS: The heart size and mediastinal contours are within normal limits. Both lungs are clear. The visualized skeletal structures are unremarkable. IMPRESSION: No active cardiopulmonary disease. Electronically Signed   By: Elgie Collard M.D.   On: 12/02/2017 03:20      Scheduled Meds: . [MAR Hold] ketorolac  30 mg Intravenous Q8H   Continuous Infusions: . sodium chloride 100 mL/hr at 12/02/17 1431  .  ceFAZolin    . lactated ringers 1,000 mL (12/03/17 1102)  . [MAR Hold] piperacillin-tazobactam (ZOSYN)  IV Stopped (12/03/17 1010)     LOS: 1 day    Time spent: 20 minutes   Noralee Stain, DO [facility] [email or web address] Password [place] 12/03/2017, 12:54 PM

## 2017-12-03 NOTE — Transfer of Care (Signed)
Immediate Anesthesia Transfer of Care Note  Patient: Shelby Nichols  Procedure(s) Performed: Procedure(s): LAPAROSCOPIC CHOLECYSTECTOMY WITH INTRAOPERATIVE CHOLANGIOGRAM (N/A)  Patient Location: PACU  Anesthesia Type:General  Level of Consciousness:  sedated, patient cooperative and responds to stimulation  Airway & Oxygen Therapy:Patient Spontanous Breathing and Patient connected to face mask oxgen  Post-op Assessment:  Report given to PACU RN and Post -op Vital signs reviewed and stable  Post vital signs:  Reviewed and stable  Last Vitals:  Vitals:   12/02/17 2159 12/03/17 0614  BP: 105/60 (!) 104/59  Pulse: (!) 112 95  Resp: 18 14  Temp: (!) 38.9 C 37.1 C  SpO2: 96% 98%    Complications: No apparent anesthesia complications

## 2017-12-03 NOTE — Discharge Instructions (Signed)

## 2017-12-03 NOTE — Op Note (Signed)
12/03/2017  1:52 PM  PATIENT:  Shelby Nichols, 27 y.o., female, MRN: [id number]  PREOP DIAGNOSIS: Acute cholecystitis, cholelithiasis  POSTOP DIAGNOSIS:   Acute and chronic cholecystitis, empyema of the gall bladder, cholelithiasis  PROCEDURE:   Procedure(s): LAPAROSCOPIC CHOLECYSTECTOMY WITH INTRAOPERATIVE CHOLANGIOGRAM  SURGEON:   Ovidio Kin, M.D.  ASSISTANT:   C. Cliffton Asters, M.D.  ANESTHESIA:   general  Anesthesiologist: Lewie Loron, MD CRNA: Theodosia Quay, CRNA  General  ASA: 2E  EBL:  minimal  ml  BLOOD ADMINISTERED: none  DRAINS: none   LOCAL MEDICATIONS USED:   30 cc of 1/2% marcaine  SPECIMEN:   Gall bladder  COUNTS CORRECT:  YES  INDICATIONS FOR PROCEDURE:  Shelby Nichols is a 27 y.o. (DOB: 09/18/1990) AA female whose primary care physician is Patient, No Pcp Per and comes for cholecystectomy.   The indications and risks of the gall bladder surgery were explained to the patient.  The risks include, but are not limited to, infection, bleeding, common bile duct injury and open surgery.  SURGERY:  The patient was taken to OR room #4 at [facility].  The abdomen was prepped with chloroprep.  The patient was given 2 gm Ancef at the beginning of the operation.   A time out was held and the surgical checklist run.   An infraumbilical incision was made into the abdominal cavity.  A 12 mm Hasson trocar was inserted into the abdominal cavity through the infraumbilical incision and secured with a 0 Vicryl suture.  Three additional trocars were inserted: a 10 mm trocar in the sub-xiphoid location, a 5 mm trocar in the right mid subcostal area, and a 5 mm trocar in the right lateral subcostal area.   The abdomen was explored and the liver, stomach, and bowel that could be seen were unremarkable.   The gall bladder was acutely inflamed.  The gall bladder was encased in omentum. The gall bladder had evidence of acute and chronic cholecystitis.   I grasped the  gall bladder and rotated it cephalad.  Disssection was carried down to the gall bladder/cystic duct junction and the cystic duct isolated.  A clip was placed on the gall bladder side of the cystic duct.   An intra-operative cholangiogram was shot.   The intra-operative cholangiogram was shot using a cut off Taut catheter placed through a 14 gauge angiocath in the RUQ.  The Taut catheter was inserted in the cut cystic duct and secured with an endoclip.  A cholangiogram was shot with 8 cc of 1/2 strength Isoview.  Using fluoroscopy, the cholangiogram showed the flow of contrast into the common bile duct, up the hepatic radicals, and into the duodenum.  There was no mass or obstruction.  This was a normal intra-operative cholangiogram.   The Taut catheter was removed.  The cystic duct was tripley endoclipped and the cystic artery was identified and clipped.  The gall bladder was bluntly and sharpley dissected from the gall bladder bed.   After the gall bladder was removed from the liver, the gall bladder bed and [place] were inspected.  There was no bleeding or bile leak.  The gall bladder was placed in a endocatch bag and delivered through the umbilicus.  The gall bladder was packed with stones. The abdomen was irrigated with 2,000 cc saline.   The trocars were then removed.  I infiltrated 30cc of 1/2% Marcaine into the incisions.  The umbilical port closed with a 0 Vicryl suture and the  skin closed with 4-0 Monocryl.  The skin was painted with DermaBond.  The patient's sponge and needle count were correct.  The patient was transported to the RR in good condition.  Ovidio Kin, MD, [facility] Surgery Pager: [phone] Office phone:  [phone]

## 2017-12-04 DIAGNOSIS — D72829 Elevated white blood cell count, unspecified: Secondary | ICD-10-CM

## 2017-12-04 LAB — CBC
HEMATOCRIT: 30.4 % — AB (ref 36.0–46.0)
Hemoglobin: 10 g/dL — ABNORMAL LOW (ref 12.0–15.0)
MCH: 29.9 pg (ref 26.0–34.0)
MCHC: 32.9 g/dL (ref 30.0–36.0)
MCV: 91 fL (ref 78.0–100.0)
Platelets: 202 10*3/uL (ref 150–400)
RBC: 3.34 MIL/uL — ABNORMAL LOW (ref 3.87–5.11)
RDW: 13.1 % (ref 11.5–15.5)
WBC: 14.5 10*3/uL — ABNORMAL HIGH (ref 4.0–10.5)

## 2017-12-04 LAB — COMPREHENSIVE METABOLIC PANEL
ALBUMIN: 2.5 g/dL — AB (ref 3.5–5.0)
ALT: 57 U/L — ABNORMAL HIGH (ref 14–54)
ANION GAP: 7 (ref 5–15)
AST: 25 U/L (ref 15–41)
Alkaline Phosphatase: 64 U/L (ref 38–126)
BILIRUBIN TOTAL: 1.1 mg/dL (ref 0.3–1.2)
BUN: 6 mg/dL (ref 6–20)
CO2: 24 mmol/L (ref 22–32)
Calcium: 8 mg/dL — ABNORMAL LOW (ref 8.9–10.3)
Chloride: 107 mmol/L (ref 101–111)
Creatinine, Ser: 0.68 mg/dL (ref 0.44–1.00)
GFR calc Af Amer: >60 mL/min (ref >60)
Glucose, Bld: 130 mg/dL — ABNORMAL HIGH (ref 65–99)
POTASSIUM: 3.8 mmol/L (ref 3.5–5.1)
Sodium: 138 mmol/L (ref 135–145)
TOTAL PROTEIN: 5.6 g/dL — AB (ref 6.5–8.1)

## 2017-12-04 MED ORDER — METRONIDAZOLE 500 MG PO TABS: 500.0000 mg | ORAL_TABLET | Freq: Three times a day (TID) | ORAL | 0 refills | Status: AC

## 2017-12-04 MED ORDER — CIPROFLOXACIN HCL 500 MG PO TABS: 500.0000 mg | ORAL_TABLET | Freq: Two times a day (BID) | ORAL | 0 refills | Status: AC

## 2017-12-04 MED ORDER — CIPROFLOXACIN HCL 500 MG PO TABS: 500.0000 mg | ORAL_TABLET | Freq: Two times a day (BID) | ORAL | 0 refills | Status: DC

## 2017-12-04 MED ORDER — IBUPROFEN 200 MG PO TABS: ORAL_TABLET | ORAL | Status: AC

## 2017-12-04 MED ORDER — METRONIDAZOLE 500 MG PO TABS: 500.0000 mg | ORAL_TABLET | Freq: Three times a day (TID) | ORAL | 0 refills | Status: DC

## 2017-12-04 MED ORDER — OXYCODONE HCL 5 MG PO TABS: 5.0000 mg | ORAL_TABLET | Freq: Four times a day (QID) | ORAL | 0 refills | Status: AC | PRN

## 2017-12-04 MED ORDER — ACETAMINOPHEN 325 MG PO TABS: ORAL_TABLET | ORAL | Status: AC

## 2017-12-04 MED ORDER — HEPARIN SODIUM (PORCINE) 5000 UNIT/ML IJ SOLN: 5000.0000 [IU] | Freq: Three times a day (TID) | INTRAMUSCULAR | Status: DC

## 2017-12-04 NOTE — Discharge Summary (Signed)
Physician Discharge Summary  Shelby Nichols NGE:[id number] DOB: 04-Dec-1990 DOA: 12/02/2017  PCP: Patient, No Pcp Per  Admit date: 12/02/2017 Discharge date: 12/04/2017  Admitted From: Home Disposition:  Home  Recommendations for Outpatient Follow-up:  1. Follow up with General Surgery on 5/28 2. Repeat CBC in 1 week to ensure resolution of leukocytosis, improving after surgery   Discharge Condition: Stable CODE STATUS: Full  Diet recommendation: Regular diet as tolerated   Brief/Interim Summary: Shelby Olivos Whiteis a 27 yo female withno previous medical history,and is 4-1/2 months postpartum,who presents with right upper quadrant abdominal pain. She was previously seen in the emergency department, diagnosed with cholelithiasis, discharged home with pain medication and antiemetic. Since being discharged from the emergency department, she has had significant and persistent right upper quadrant abdominal pain, radiating to the back, with ongoing nausea and vomiting. She states that she vomited at least 5 times which was bilious in nature. She also noticed some blood specks mixed in with her vomitus. General surgery was consulted on admission. She underwent lap cholecystectomy 5/13.   Discharge Diagnoses:  Principal Problem:   Cholecystitis Active Problems:   Abdominal pain   Leukocytosis  Symptomatic cholelithiasis -General surgery consulted -S/p lap cholecystectomy 5/13 -Treated with IV Zosyn --> Cipro/Flagyl for 4 more days    Discharge Instructions  Discharge Instructions    Call MD for:  difficulty breathing, headache or visual disturbances   Complete by:  As directed    Call MD for:  extreme fatigue   Complete by:  As directed    Call MD for:  hives   Complete by:  As directed    Call MD for:  persistant dizziness or light-headedness   Complete by:  As directed    Call MD for:  persistant nausea and vomiting   Complete by:  As directed    Call MD for:  redness,  tenderness, or signs of infection (pain, swelling, redness, odor or green/yellow discharge around incision site)   Complete by:  As directed    Call MD for:  severe uncontrolled pain   Complete by:  As directed    Call MD for:  temperature >100.4   Complete by:  As directed    Diet general   Complete by:  As directed    Discharge instructions   Complete by:  As directed    You were cared for by a hospitalist during your hospital stay. If you have any questions about your discharge medications or the care you received while you were in the hospital after you are discharged, you can call the unit and ask to speak with the hospitalist on call if the hospitalist that took care of you is not available. Once you are discharged, your primary care physician will handle any further medical issues. Please note that NO REFILLS for any discharge medications will be authorized once you are discharged, as it is imperative that you return to your primary care physician (or establish a relationship with a primary care physician if you do not have one) for your aftercare needs so that they can reassess your need for medications and monitor your lab values.   Increase activity slowly   Complete by:  As directed      Allergies as of 12/04/2017   No Known Allergies     Medication List    TAKE these medications   ciprofloxacin 500 MG tablet Commonly known as:  CIPRO Take 1 tablet (500 mg total) by mouth 2 (  two) times daily for 4 days.   ibuprofen 600 MG tablet Commonly known as:  ADVIL,MOTRIN Take 1 tablet (600 mg total) by mouth every 6 (six) hours. What changed:    when to take this  reasons to take this   metroNIDAZOLE 500 MG tablet Commonly known as:  FLAGYL Take 1 tablet (500 mg total) by mouth 3 (three) times daily for 4 days.   ondansetron 4 MG disintegrating tablet Commonly known as:  ZOFRAN ODT Take 1 tablet (4 mg total) by mouth every 8 (eight) hours as needed for nausea or vomiting.    oxyCODONE 5 MG immediate release tablet Commonly known as:  Oxy IR/ROXICODONE Take 1 tablet (5 mg total) by mouth every 4 (four) hours as needed for moderate pain.   oxyCODONE-acetaminophen 5-325 MG tablet Commonly known as:  PERCOCET/ROXICET Take 1 tablet by mouth every 8 (eight) hours as needed for severe pain.   VITAFOL ULTRA 29-0.6-0.4-200 MG Caps Take 1 tablet by mouth daily.      Follow-up Information    Surgery, [facility] Follow up on 12/18/2017.   Specialty:  General Surgery Why:  Your appointment is at 11:15 AM.  Bring photo ID and insurance information. Please arrive 30 min prior to appointment time.  Contact information: [address] [place] [address] [phone]          No Known Allergies  Consultations:  General surgery    Procedures/Studies: Dg Chest 2 View  Result Date: 12/02/2017 CLINICAL DATA:  27 year old female with fever. EXAM: CHEST - 2 VIEW COMPARISON:  None. FINDINGS: The heart size and mediastinal contours are within normal limits. Both lungs are clear. The visualized skeletal structures are unremarkable. IMPRESSION: No active cardiopulmonary disease. Electronically Signed   By: Elgie Collard M.D.   On: 12/02/2017 03:20   Dg Cholangiogram Operative  Result Date: 12/03/2017 CLINICAL DATA:  Cholelithiasis EXAM: INTRAOPERATIVE CHOLANGIOGRAM TECHNIQUE: Cholangiographic images from the C-arm fluoroscopic device were submitted for interpretation post-operatively. Please see the procedural report for the amount of contrast and the fluoroscopy time utilized. COMPARISON:  Ultrasound 12/01/2017 FINDINGS: No persistent filling defects in the common duct. Intrahepatic ducts are incompletely visualized, appearing decompressed centrally. Contrast passes into the duodenum. : Negative for retained common duct stone. Electronically Signed   By: Corlis Leak M.D.   On: 12/03/2017 14:26   US Abdomen Limited  Result Date: 12/01/2017 CLINICAL  DATA:  Right upper quadrant abdomen pain since 12 a.m. with nausea and vomiting EXAM: ULTRASOUND ABDOMEN LIMITED RIGHT UPPER QUADRANT COMPARISON:  None. FINDINGS: Gallbladder: Sludge and gallstones are identified in the gallbladder. No wall thickening visualized. No sonographic Murphy sign noted by sonographer. Common bile duct: Diameter: 8.2 mm Liver: There is a 1.1 x 1.3 x 0.9 cm hyperechoic lesion in the right lobe liver. Within normal limits in parenchymal echogenicity. Portal vein is patent on color Doppler imaging with normal direction of blood flow towards the liver. IMPRESSION: Sludge and gallstones are identified in the gallbladder. There is no sonographic evidence of acute cholecystitis. The common bile duct is dilated at 8.2 mm. Choledocholithiasis is not excluded. 1.3 cm hyperechoic lesion in the right lobe liver. This is nonspecific. Differential diagnosis includes but is not limited to hemangioma. Electronically Signed   By: Sherian Rein M.D.   On: 12/01/2017 09:25      Discharge Exam: Vitals:   12/04/17 0555 12/04/17 1000  BP: 103/65 136/76  Pulse: 92 69  Resp: 17 18  Temp: 98.5  F (36.9 C) 99.1 F (37.3 C)  SpO2: 100% 100%    General: Pt is alert, awake, not in acute distress Cardiovascular: RRR, S1/S2 +, no rubs, no gallops Respiratory: CTA bilaterally, no wheezing, no rhonchi Abdominal: Soft, NT, ND, bowel sounds + Extremities: no edema, no cyanosis    The results of significant diagnostics from this hospitalization (including imaging, microbiology, ancillary and laboratory) are listed below for reference.     Microbiology: Recent Results (from the past 240 hour(s))  Culture, blood (routine x 2)     Status: None (Preliminary result)   Collection Time: 12/02/17  1:00 AM  Result Value Ref Range Status   Specimen Description   Final    LEFT ANTECUBITAL Performed at [facility], [address], [place], [place] [address]    Special Requests    Final    BOTTLES DRAWN AEROBIC AND ANAEROBIC Blood Culture adequate volume Performed at [facility], [address], [place], [place] [address]    Culture   Final    NO GROWTH 2 DAYS Performed at [facility], [address] [place], [place] [address]    Report Status PENDING  Incomplete  Culture, blood (routine x 2)     Status: None (Preliminary result)   Collection Time: 12/02/17  1:05 AM  Result Value Ref Range Status   Specimen Description   Final    RIGHT ANTECUBITAL Performed at [facility], [address], [place], [place] [address]    Special Requests   Final    BOTTLES DRAWN AEROBIC AND ANAEROBIC Blood Culture adequate volume Performed at [facility], [address], [place], [place] [address]    Culture   Final    NO GROWTH 2 DAYS Performed at [facility], [address], [place], [place] [address]    Report Status PENDING  Incomplete  Surgical pcr screen     Status: None   Collection Time: 12/02/17  7:59 PM  Result Value Ref Range Status   MRSA, PCR NEGATIVE NEGATIVE Final   Staphylococcus aureus NEGATIVE NEGATIVE Final    Comment: (NOTE) The Xpert SA Assay (FDA approved for NASAL specimens in patients 56 years of age and older), is one component of a comprehensive surveillance program. It is not intended to diagnose infection nor to guide or monitor treatment. Performed at [facility], [address], [place], [place] [address]      Labs: BNP (last 3 results) No results for input(s): BNP in the last 8760 hours. Basic Metabolic Panel: Recent Labs  Lab 12/01/17 0457 12/02/17 0023 12/02/17 0642 12/03/17 0432 12/04/17 0441  NA 138 140 139 137 138  K 3.0* 3.4* 3.5 3.3* 3.8  CL 104 106 105 105 107  CO2 22 20* 21* 23 24  GLUCOSE 119* 132* 115* 105* 130*  BUN 8 6 5* <5* 6  CREATININE 0.80 0.76 0.65 0.74 0.68  CALCIUM 8.9 9.5 8.2* 7.4* 8.0*   Liver Function  Tests: Recent Labs  Lab 12/01/17 0457 12/02/17 0023 12/02/17 0642 12/03/17 0432 12/04/17 0441  AST 23 105* 80* 40 25  ALT 18 121* 101* 77* 57*  ALKPHOS 67 93 76 62 64  BILITOT 0.6 1.0 1.4* 1.7* 1.1  PROT 7.6 8.1 6.5 5.3* 5.6*  ALBUMIN 4.2 4.3 3.4* 2.6* 2.5*   Recent Labs  Lab 12/01/17 0457 12/02/17 0023 12/03/17 0432  LIPASE No results for input(s): AMMONIA  in the last 168 hours. CBC: Recent Labs  Lab 12/01/17 0457 12/02/17 0023 12/02/17 0056 12/02/17 0642 12/03/17 0432 12/04/17 0441  WBC 9.1 18.3*  --  18.6* 18.5* 14.5*  NEUTROABS  --   --  15.1*  --   --   --   HGB 13.5 13.7  --  12.4 10.5* 10.0*  HCT 40.5 40.7  --  38.1 32.0* 30.4*  MCV 89.6 88.7  --  90.5 91.7 91.0  PLT 294 326  --  268 204 202   Cardiac Enzymes: No results for input(s): CKTOTAL, CKMB, CKMBINDEX, TROPONINI in the last 168 hours. BNP: Invalid input(s): POCBNP CBG: No results for input(s): GLUCAP in the last 168 hours. D-Dimer No results for input(s): DDIMER in the last 72 hours. Hgb A1c No results for input(s): HGBA1C in the last 72 hours. Lipid Profile No results for input(s): CHOL, HDL, LDLCALC, TRIG, CHOLHDL, LDLDIRECT in the last 72 hours. Thyroid function studies No results for input(s): TSH, T4TOTAL, T3FREE, THYROIDAB in the last 72 hours.  Invalid input(s): FREET3 Anemia work up No results for input(s): VITAMINB12, FOLATE, FERRITIN, TIBC, IRON, RETICCTPCT in the last 72 hours. Urinalysis    Component Value Date/Time   COLORURINE YELLOW 12/01/2017 2352   APPEARANCEUR HAZY (A) 12/01/2017 2352   LABSPEC 1.028 12/01/2017 2352   PHURINE 5.0 12/01/2017 2352   GLUCOSEU NEGATIVE 12/01/2017 2352   HGBUR LARGE (A) 12/01/2017 2352   BILIRUBINUR NEGATIVE 12/01/2017 2352   KETONESUR 80 (A) 12/01/2017 2352   PROTEINUR 100 (A) 12/01/2017 2352   UROBILINOGEN 2.0 (H) 05/02/2015 1000   NITRITE NEGATIVE 12/01/2017 2352   LEUKOCYTESUR TRACE (A) 12/01/2017 2352   Sepsis  Labs Invalid input(s): PROCALCITONIN,  WBC,  LACTICIDVEN Microbiology Recent Results (from the past 240 hour(s))  Culture, blood (routine x 2)     Status: None (Preliminary result)   Collection Time: 12/02/17  1:00 AM  Result Value Ref Range Status   Specimen Description   Final    LEFT ANTECUBITAL Performed at [facility], [address], [place], [place] [address]    Special Requests   Final    BOTTLES DRAWN AEROBIC AND ANAEROBIC Blood Culture adequate volume Performed at [facility], [address] [place], [place] [address]    Culture   Final    NO GROWTH 2 DAYS Performed at [facility], [address] [place], [place] [address]    Report Status PENDING  Incomplete  Culture, blood (routine x 2)     Status: None (Preliminary result)   Collection Time: 12/02/17  1:05 AM  Result Value Ref Range Status   Specimen Description   Final    RIGHT ANTECUBITAL Performed at [facility], [address], [place], [place] [address]    Special Requests   Final    BOTTLES DRAWN AEROBIC AND ANAEROBIC Blood Culture adequate volume Performed at [facility], [address] [place], [place] [address]    Culture   Final    NO GROWTH 2 DAYS Performed at [facility], [address], [place], [place] [address]    Report Status PENDING  Incomplete  Surgical pcr screen     Status: None   Collection Time: 12/02/17  7:59 PM  Result Value Ref Range Status   MRSA, PCR NEGATIVE NEGATIVE Final   Staphylococcus aureus NEGATIVE NEGATIVE Final    Comment: (NOTE) The Xpert SA Assay (FDA approved for NASAL specimens in  patients 2 years of age and older), is one component of a comprehensive surveillance program. It is not intended to diagnose infection nor to guide or monitor treatment. Performed at [facility], [address] [place], [place] [address]      Patient was seen and  examined on the day of discharge and was found to be in stable condition. Time coordinating discharge: 25 minutes including assessment and coordination of care, as well as examination of the patient.   SIGNED:  Noralee Stain, DO [facility] Pager [phone]  If 7PM-7AM, please contact night-coverage [email or web address] Password [email or web address] 12/04/2017, 1:40 PM

## 2017-12-04 NOTE — Progress Notes (Signed)
Discharge instructions reviewed with patient. All questions answered. Patient wheeled out to vehicle with belongings by nurse tech 

## 2017-12-04 NOTE — Progress Notes (Addendum)
1 Day Post-Op    CC: Right upper quadrant pain  Subjective: Patient has been walking some.  She is extremely sore.  7 children at home the youngest is about 69 months old.  She has no appetite at this point.  Port sites all look good.  Objective: Vital signs in last 24 hours: Temp:  [97.7 F (36.5 C)-99.5 F (37.5 C)] 98.5 F (36.9 C) (05/14 0555) Pulse Rate:  [50-108] 92 (05/14 0555) Resp:  [11-21] 17 (05/14 0555) BP: (96-121)/(47-70) 103/65 (05/14 0555) SpO2:  [98 %-100 %] 100 % (05/14 0555) Weight:  [66.8 kg (147 lb 4.3 oz)-81.2 kg (179 lb)] 66.8 kg (147 lb 4.3 oz) (05/14 0500)  330 PO 2700 IV 600 urine Afebrile, VSS Glucose 130 WBC 14.5 H/H down 10/30.4 Hepatitis A, B, and C negative IOC: Negative for retained common duct stones. Intake/Output from previous day: 05/13 0701 - 05/14 0700 In: 3051.3 [P.O.:330; I.V.:2621.3; IV Piggyback:100] Out: 625 [Urine:600; Blood:25] Intake/Output this shift: No intake/output data recorded.  General appearance: alert, cooperative, no distress and Very sore. Resp: clear to auscultation bilaterally Abdomen: Soft, tender on the port sites all look good.  Lab Results:  Recent Labs    12/03/17 0432 12/04/17 0441  WBC 18.5* 14.5*  HGB 10.5* 10.0*  HCT 32.0* 30.4*  PLT 204 202    BMET Recent Labs    12/03/17 0432 12/04/17 0441  NA 137 138  K 3.3* 3.8  CL 105 107  CO2 23 24  GLUCOSE 105* 130*  BUN <5* 6  CREATININE 0.74 0.68  CALCIUM 7.4* 8.0*   PT/INR No results for input(s): LABPROT, INR in the last 72 hours.  Recent Labs  Lab 12/01/17 0457 12/02/17 0023 12/02/17 0642 12/03/17 0432 12/04/17 0441  AST 23 105* 80* 40 25  ALT 18 121* 101* 77* 57*  ALKPHOS 67 93 76 62 64  BILITOT 0.6 1.0 1.4* 1.7* 1.1  PROT 7.6 8.1 6.5 5.3* 5.6*  ALBUMIN 4.2 4.3 3.4* 2.6* 2.5*     Lipase     Component Value Date/Time   LIPASE 19 12/03/2017 0432     Medications: . acetaminophen  650 mg Oral Q8H  . ketorolac  30 mg  Intravenous Q8H   . sodium chloride Stopped (12/03/17 1900)  . dextrose 5 % and 0.45 % NaCl with KCl 20 mEq/L 75 mL/hr at 12/04/17 0601  . piperacillin-tazobactam (ZOSYN)  IV Stopped (12/04/17 7829)   Anti-infectives (From admission, onward)   Start     Dose/Rate Route Frequency Ordered Stop   12/03/17 1136  ceFAZolin (ANCEF) 2-4 GM/100ML-% IVPB    Note to Pharmacy:  Leroy Libman   : cabinet override      12/03/17 1136 12/03/17 2344   12/03/17 0600  ceFAZolin (ANCEF) IVPB 2g/100 mL premix  Status:  Discontinued     2 g 200 mL/hr over 30 Minutes Intravenous On call to O.R. 12/02/17 1921 12/03/17 0804   12/02/17 0300  piperacillin-tazobactam (ZOSYN) IVPB 3.375 g     3.375 g 12.5 mL/hr over 240 Minutes Intravenous Every 8 hours 12/02/17 0252     12/02/17 0100  cefTRIAXone (ROCEPHIN) 2 g in sodium chloride 0.9 % 100 mL IVPB     2 g 200 mL/hr over 30 Minutes Intravenous  Once 12/02/17 0046 12/02/17 0210     Assessment/Plan Post partum 07/23/17 G7P7 BMI 28.76     Acute and chronic cholecystitis, empyema of the gall bladder, cholelithiasis LAPAROSCOPIC CHOLECYSTECTOMY WITH INTRAOPERATIVE CHOLANGIOGRAM, 12/03/17, Dr. Onalee Hua  [name]  FEN:  IV fluids/clears ID:  Rocephin 5/12/Zosyn 5/12 =>> day 3 Need 7 days total abx DVT: Heparin restart later today if not discharged Follow up:  [facility] clinic  Plan: She has no appetite and is tearful when I tell her she needs to eat before she goes home.  I explained she needed to be tolerating diet before she goes home.    We also plan to keep her on a total of 7 days of antibiotics.  We will mobilize her and advance her diet and see how she does.    She does well will aim for discharge later today.     LOS: 2 days   [name],[name] 12/04/2017 [phone]  Agree with above.  She is doing better than this AM and she can go home.  And she is ready to go home. Will follow up in the office in 2 to 3 weeks.  Ovidio Kin, MD, [facility]  Surgery Pager: [phone] Office phone:  [phone]

## 2017-12-07 LAB — CULTURE, BLOOD (ROUTINE X 2)
Culture: NO GROWTH
Culture: NO GROWTH
SPECIAL REQUESTS: ADEQUATE
Special Requests: ADEQUATE

## 2018-01-23 ENCOUNTER — Ambulatory Visit: Payer: Medicaid Other | Admitting: Obstetrics

## 2018-07-04 IMAGING — US US MFM OB FOLLOW-UP
2 series · 14 of 28 positions shown · non-contrast
Comparison: none

[Series 1: us mfm ob follow-up · 12 of 28 slices shown (1 of 2)]
[im 2/28]
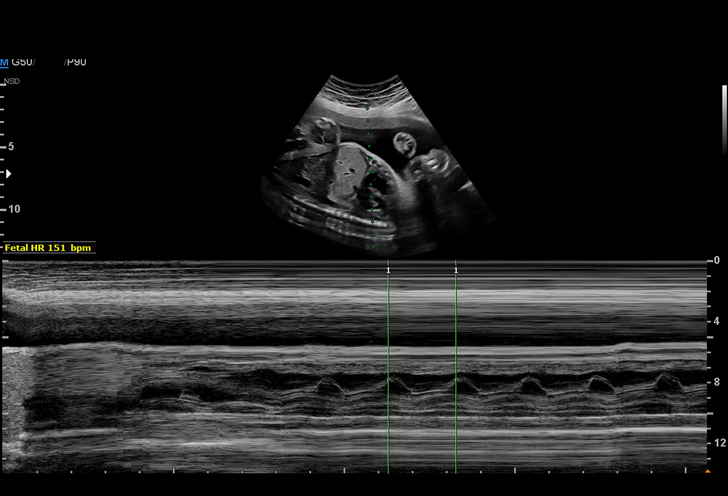
[im 4/28]
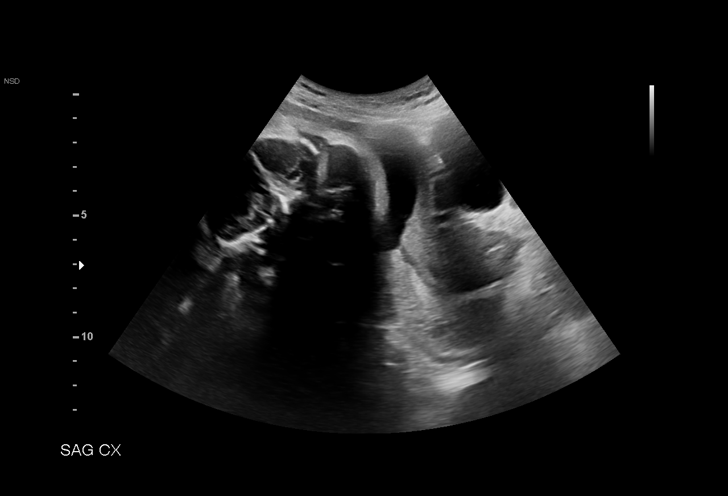
[im 6/28]
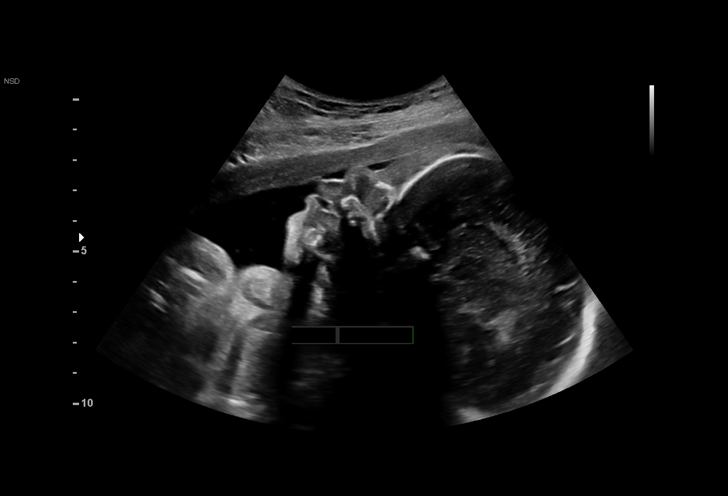
[im 9/28]
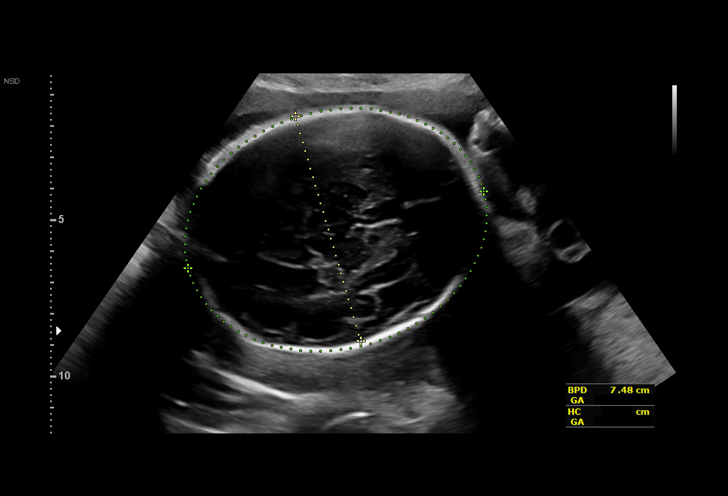
[im 11/28]
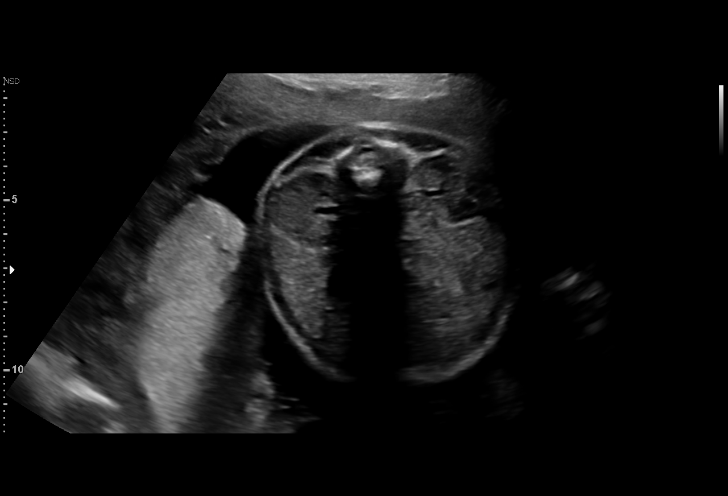
[im 13/28]
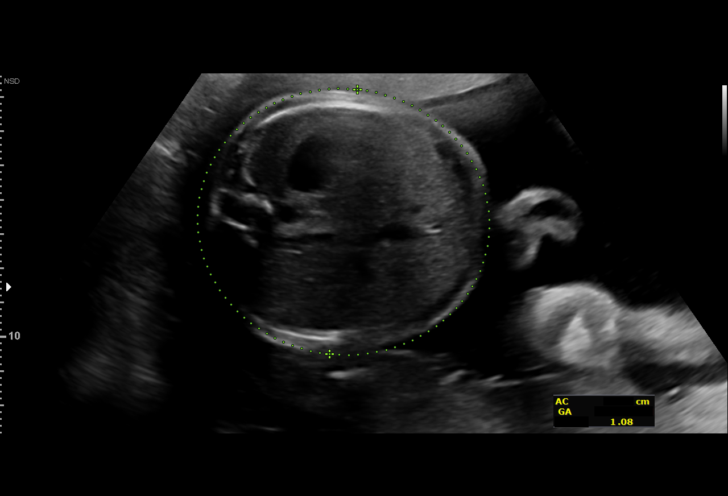
[im 16/28]
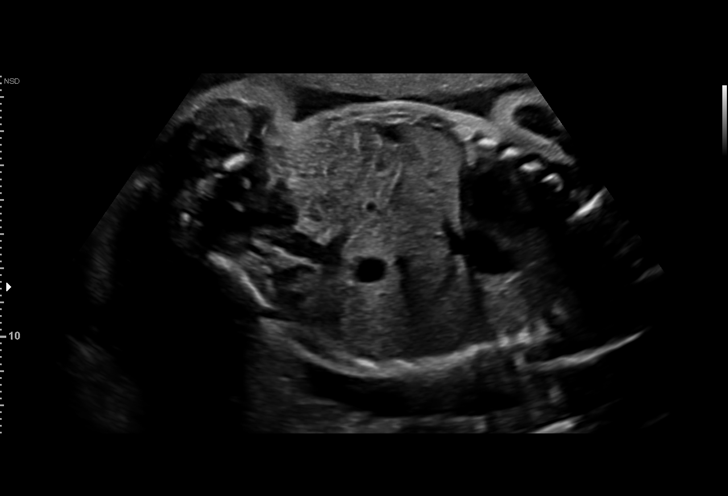
[im 18/28]
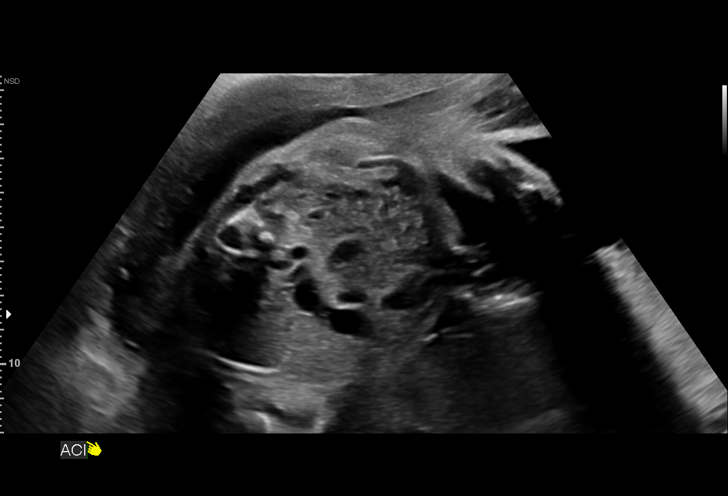
[im 20/28]
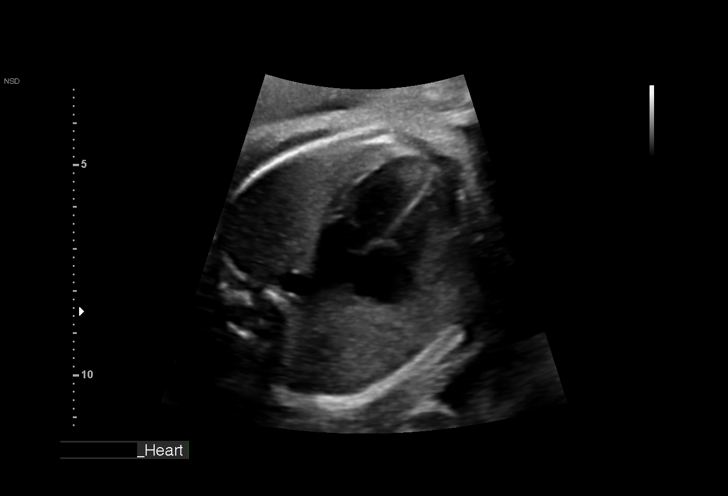
[im 23/28]
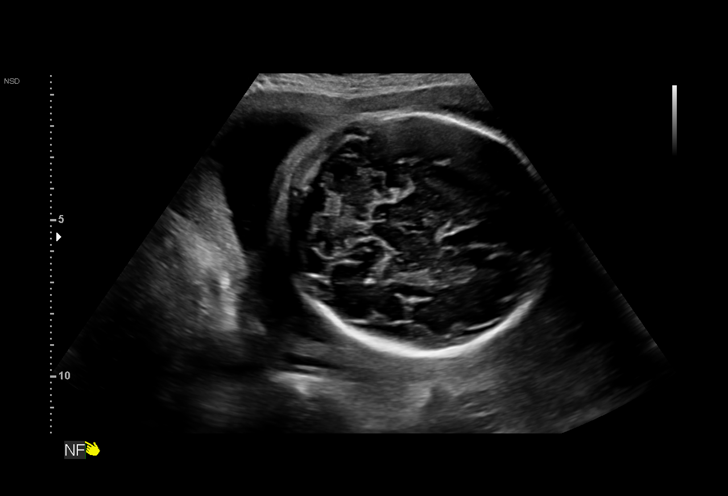
[im 25/28]
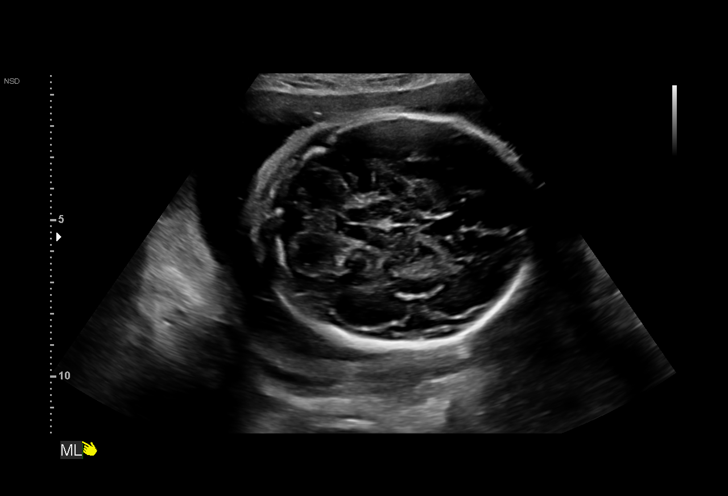
[im 28/28]
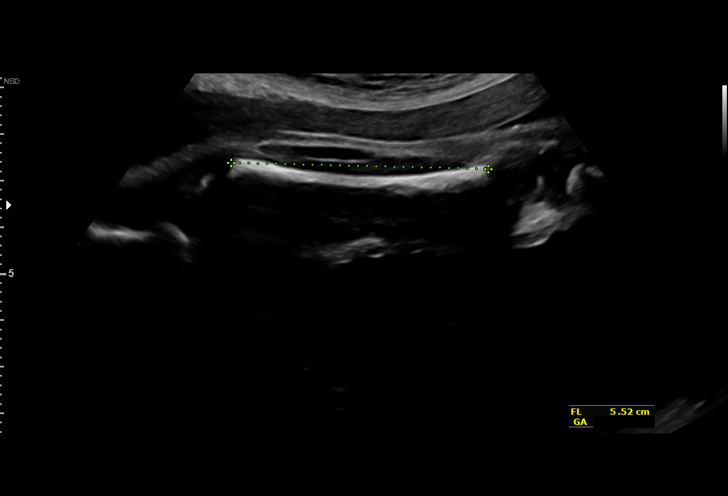

[Series 3: us mfm ob follow-up · 2 of 4 slices shown (2 of 2)]
[im 2/4]
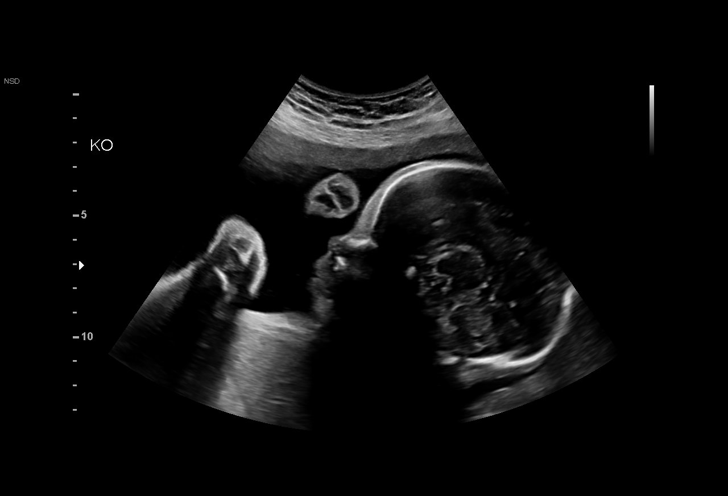
[im 4/4]
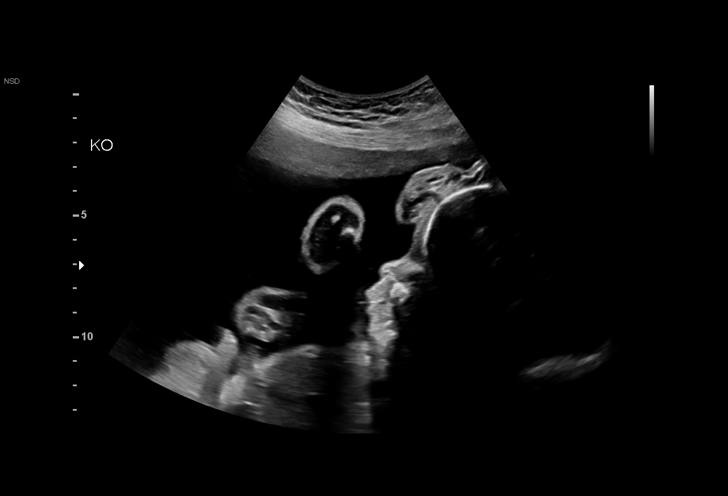

[14 of 28 positions shown; findings below may reference images not displayed]

Performed By:     Nola Oxendine     Secondary Phy.:   [[email or web address]]
Healthcare -
[HOSPITAL]([HOSPITAL]
)

1  OFIS MEBELI CAMALDINOV           [id number]      [id number]     [id number]
Indications

29 weeks gestation of pregnancy
Insufficient Prenatal Care
Obesity complicating pregnancy, third
trimester
Encounter for other antenatal screening
follow-up
OB History

Blood Type:            Height:  5'0"   Weight (lb):  180       BMI:
Gravidity:    7         Term:   6
Living:       6
Fetal Evaluation

Num Of Fetuses:     1
Fetal Heart         151
Rate(bpm):
Cardiac Activity:   Observed
Presentation:       Cephalic
Placenta:           Posterior, above cervical os
P. Cord Insertion:  Previously seen as normal
Amniotic Fluid
AFI FV:      Subjectively within normal limits

AFI Sum(cm)     %Tile       Largest Pocket(cm)
19.09           74

RUQ(cm)       RLQ(cm)       LUQ(cm)        LLQ(cm)
7.53
Biometry

BPD:      74.9  mm     G. Age:  30w 0d         62  %    CI:        75.04   %    70 - 86
FL/HC:      20.1   %    19.6 -
HC:      274.3  mm     G. Age:  30w 0d         36  %    HC/AC:      1.09        0.99 -
AC:      252.8  mm     G. Age:  29w 3d         50  %    FL/BPD:     73.7   %    71 - 87
FL:       55.2  mm     G. Age:  29w 1d         31  %    FL/AC:      21.8   %    20 - 24

Est. FW:    2883  gm      3 lb 1 oz     55  %
Gestational Age

LMP:           25w 6d        Date:  11/30/16                 EDD:   09/06/17
U/S Today:     29w 5d                                        EDD:   08/10/17
Best:          29w 2d     Det. By:  U/S  (03/08/17)          EDD:   08/13/17
Anatomy

Cranium:               Appears normal         Aortic Arch:            Previously seen
Cavum:                 Previously seen        Ductal Arch:            Previously seen
Ventricles:            Appears normal         Diaphragm:              Appears normal
Choroid Plexus:        Previously seen        Stomach:                Appears normal, left
sided
Cerebellum:            Previously seen        Abdomen:                Appears normal
Posterior Fossa:       Previously seen        Abdominal Wall:         Appears nml (cord
insert, abd wall)
Nuchal Fold:           Not applicable (>20    Cord Vessels:           Previously seen
wks GA)
Face:                  Orbits and profile     Kidneys:                Appear normal
previously seen
Lips:                  Previously seen        Bladder:                Appears normal
Thoracic:              Appears normal         Spine:                  Previously seen
Heart:                 Appears normal         Upper Extremities:      Previously seen
(4CH, axis, and situs
RVOT:                  Previously seen        Lower Extremities:      Previously seen
LVOT:                  Previously seen

Other:  Male gender previously seen. Nasal bone previously visualized.
Cervix Uterus Adnexa

Cervix
Length:              5  cm.
Normal appearance by transabdominal scan.

Left Ovary
Previously seen.

Right Ovary
Previously seen
Impression

SIUP at 29+2 weeks
Normal interval anatomy; anatomic survey complete
Normal amniotic fluid volume
Appropriate interval growth with EFW at the 55th %tile
Recommendations

Follow-up as clinically indicated

## 2019-01-05 IMAGING — US US ABDOMEN LIMITED
1 series · 14 of 25 positions shown · non-contrast
Comparison: None.

CLINICAL DATA: Right upper quadrant abdomen pain since 12 a.m. with
nausea and vomiting

EXAM:
ULTRASOUND ABDOMEN LIMITED RIGHT UPPER QUADRANT

[Series 1: us abdomen limited · 14 of 44 slices shown]
[im 1/44]
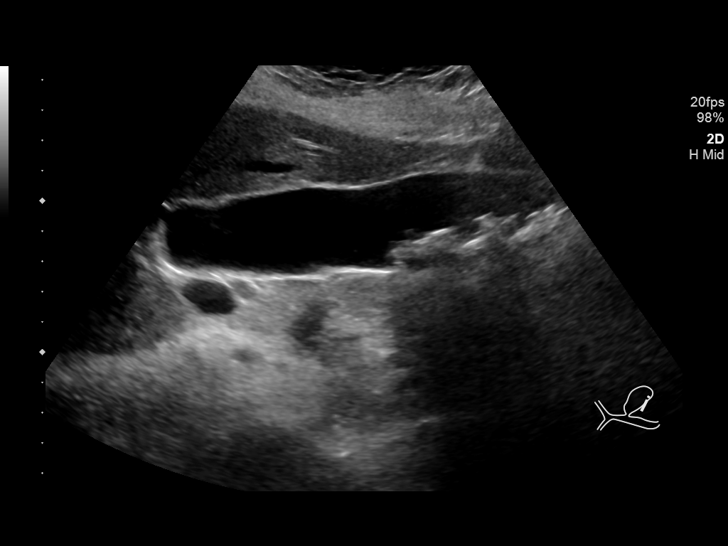
[im 4/44]
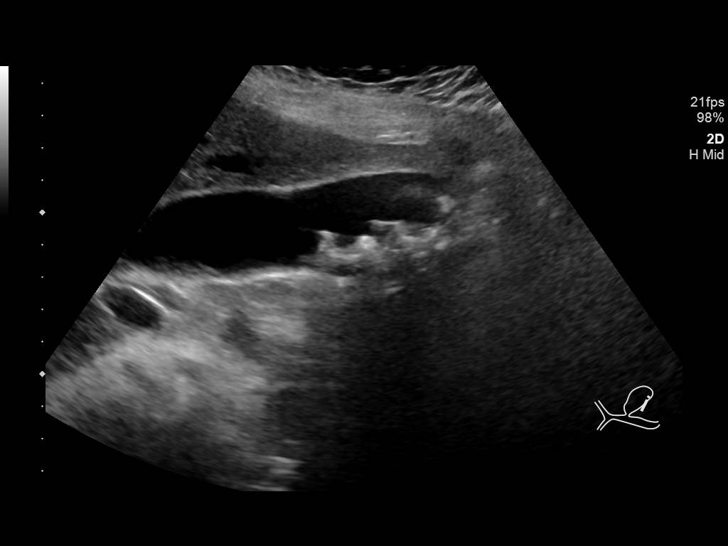
[im 8/44]
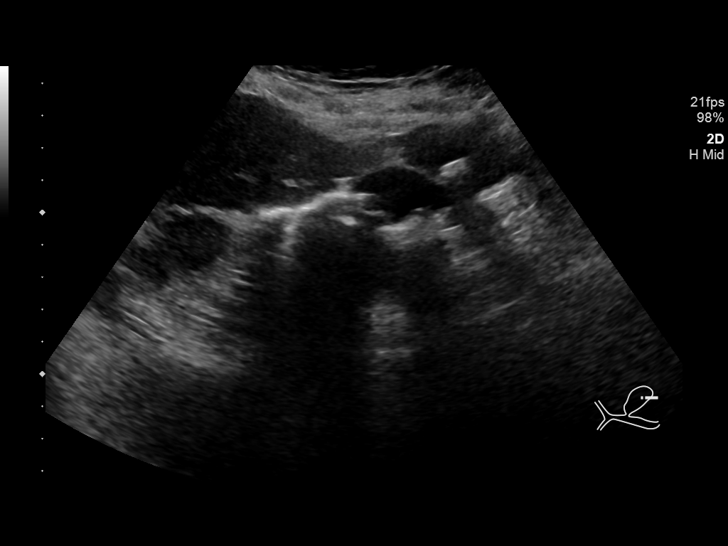
[im 11/44]
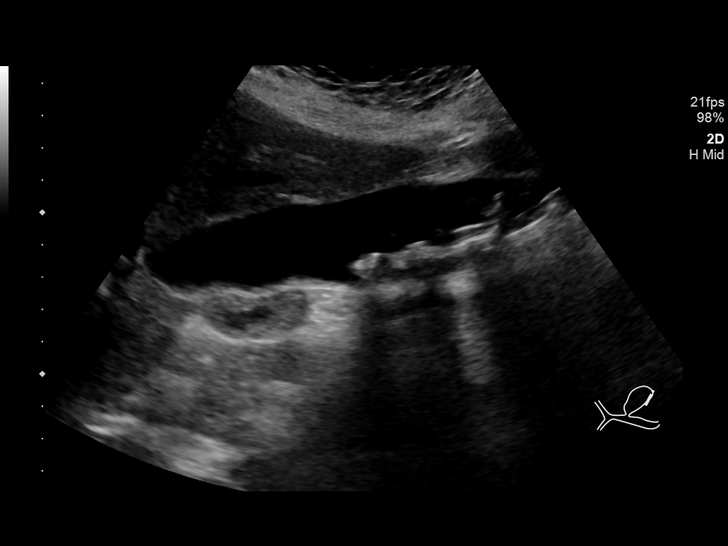
[im 15/44]
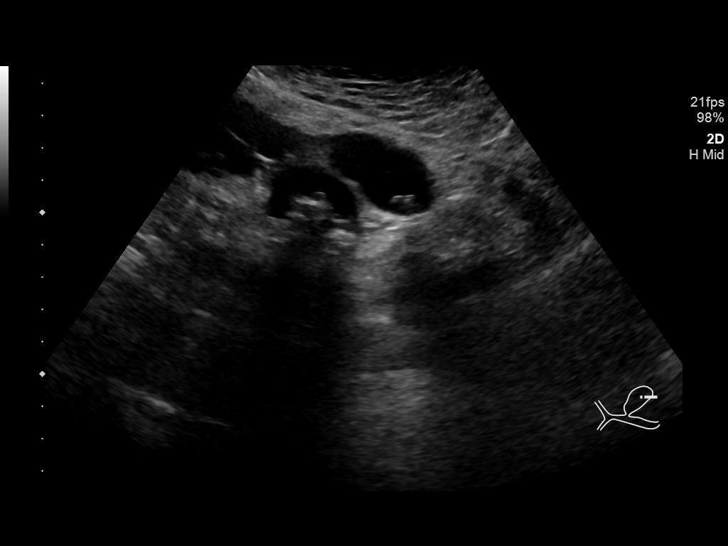
[im 17/44]
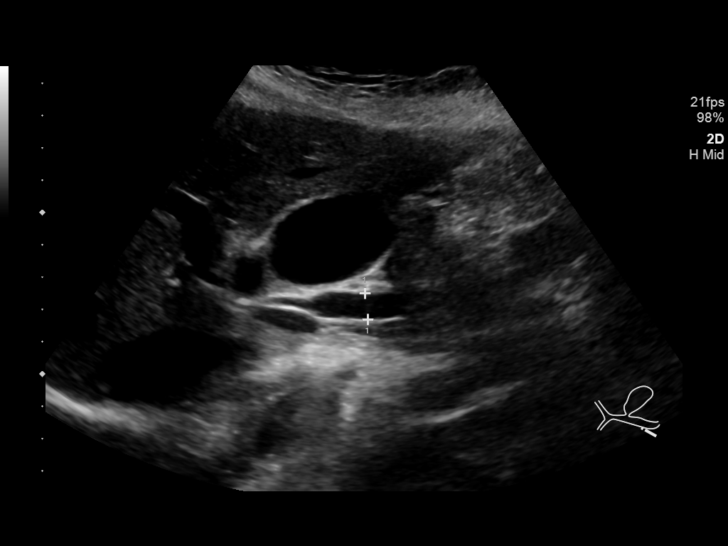
[im 20/44]
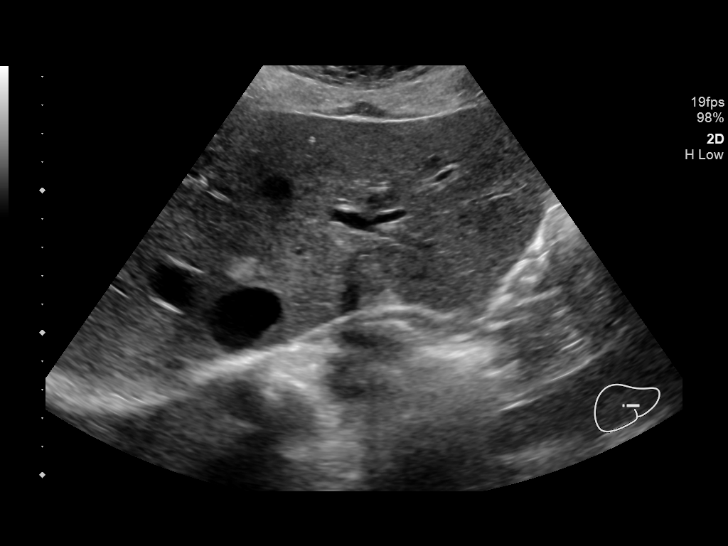
[im 24/44]
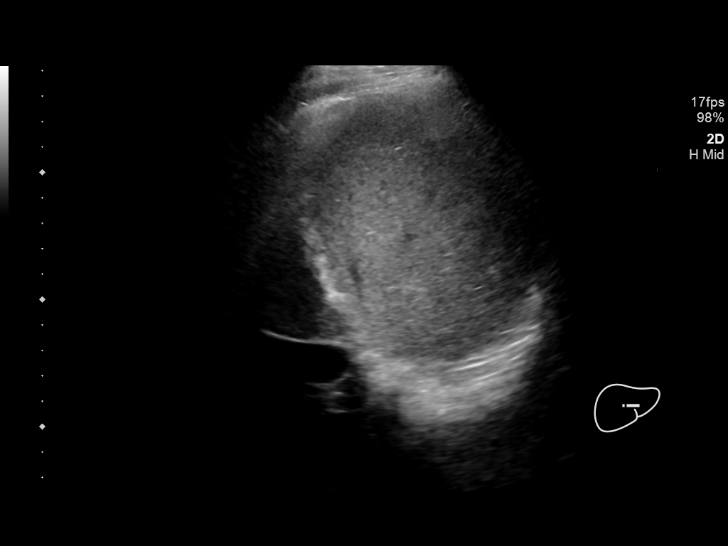
[im 27/44]
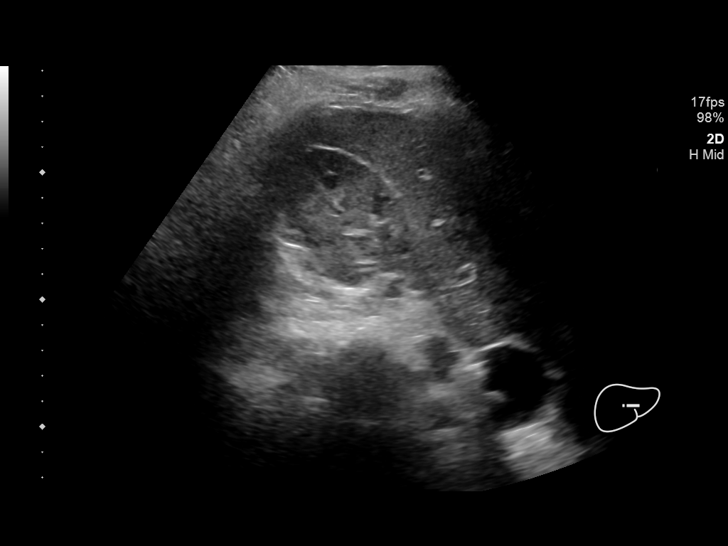
[im 29/44]
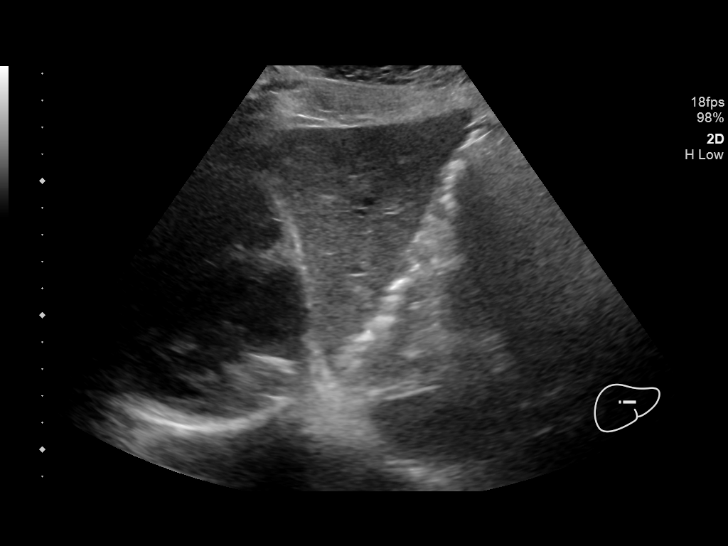
[im 33/44]
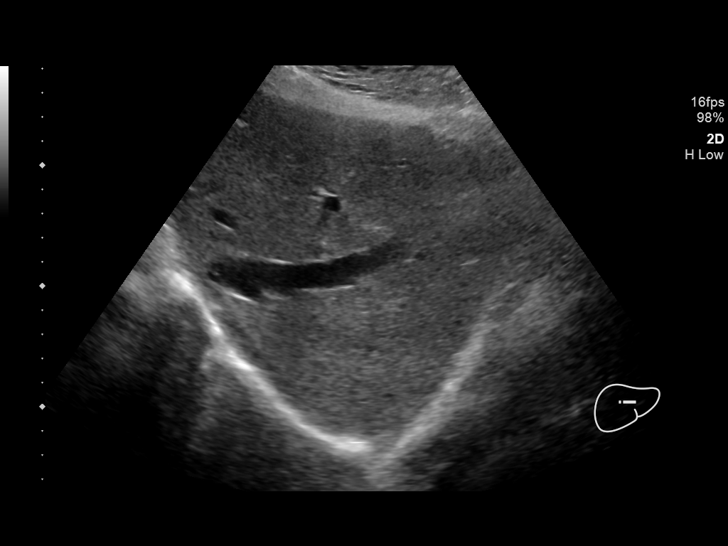
[im 36/44]
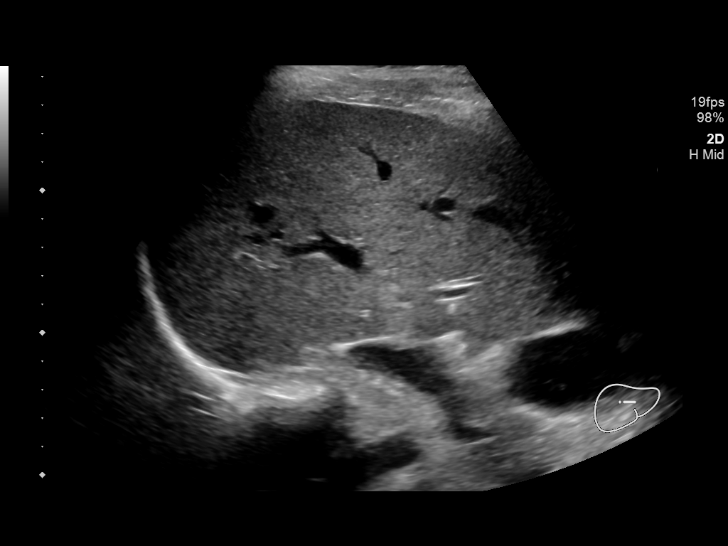
[im 40/44]
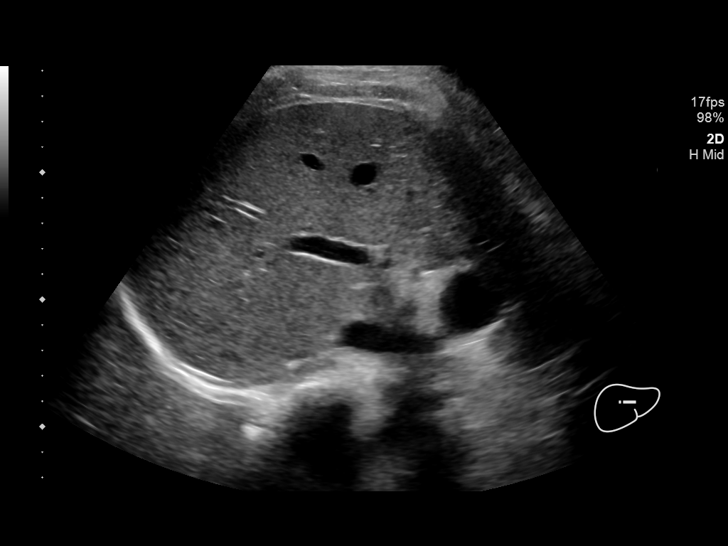
[im 44/44]
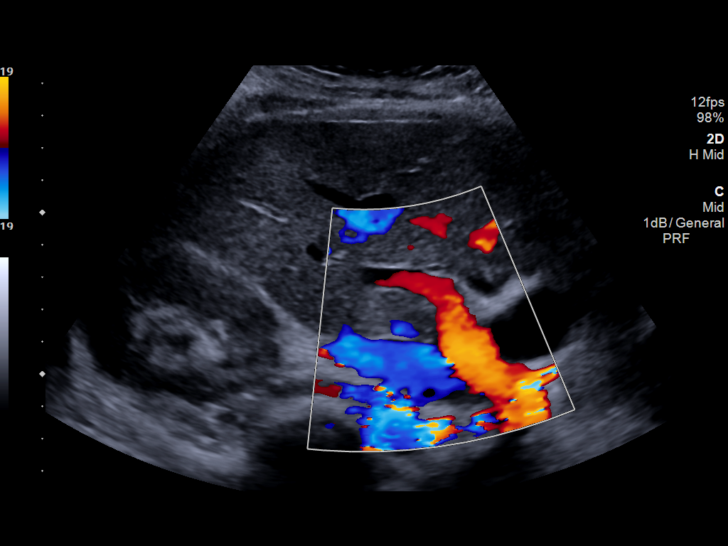

[14 of 25 positions shown; findings below may reference images not displayed]

FINDINGS: Gallbladder:

Sludge and gallstones are identified in the gallbladder. No wall
thickening visualized. No sonographic Murphy sign noted by
sonographer.

Common bile duct:

Diameter: 8.2 mm

Liver:

There is a 1.1 x 1.3 x 0.9 cm hyperechoic lesion in the right lobe
liver. Within normal limits in parenchymal echogenicity. Portal vein
is patent on color Doppler imaging with normal direction of blood
flow towards the liver.
IMPRESSION: Sludge and gallstones are identified in the gallbladder. There is no
sonographic evidence of acute cholecystitis.

The common bile duct is dilated at 8.2 mm. Choledocholithiasis is
not excluded.

1.3 cm hyperechoic lesion in the right lobe liver. This is
nonspecific. Differential diagnosis includes but is not limited to
hemangioma.

## 2019-01-07 IMAGING — RF DG CHOLANGIOGRAM OPERATIVE
1 series · 5 of 5 positions shown · non-contrast
Comparison: Ultrasound 12/01/2017

CLINICAL DATA: Cholelithiasis

EXAM:
INTRAOPERATIVE CHOLANGIOGRAM
TECHNIQUE: Cholangiographic images from the C-arm fluoroscopic device were
submitted for interpretation post-operatively. Please see the
procedural report for the amount of contrast and the fluoroscopy
time utilized.

[Series 1: run · 2 acquisitions, 5 frames shown]
[im 1/2]
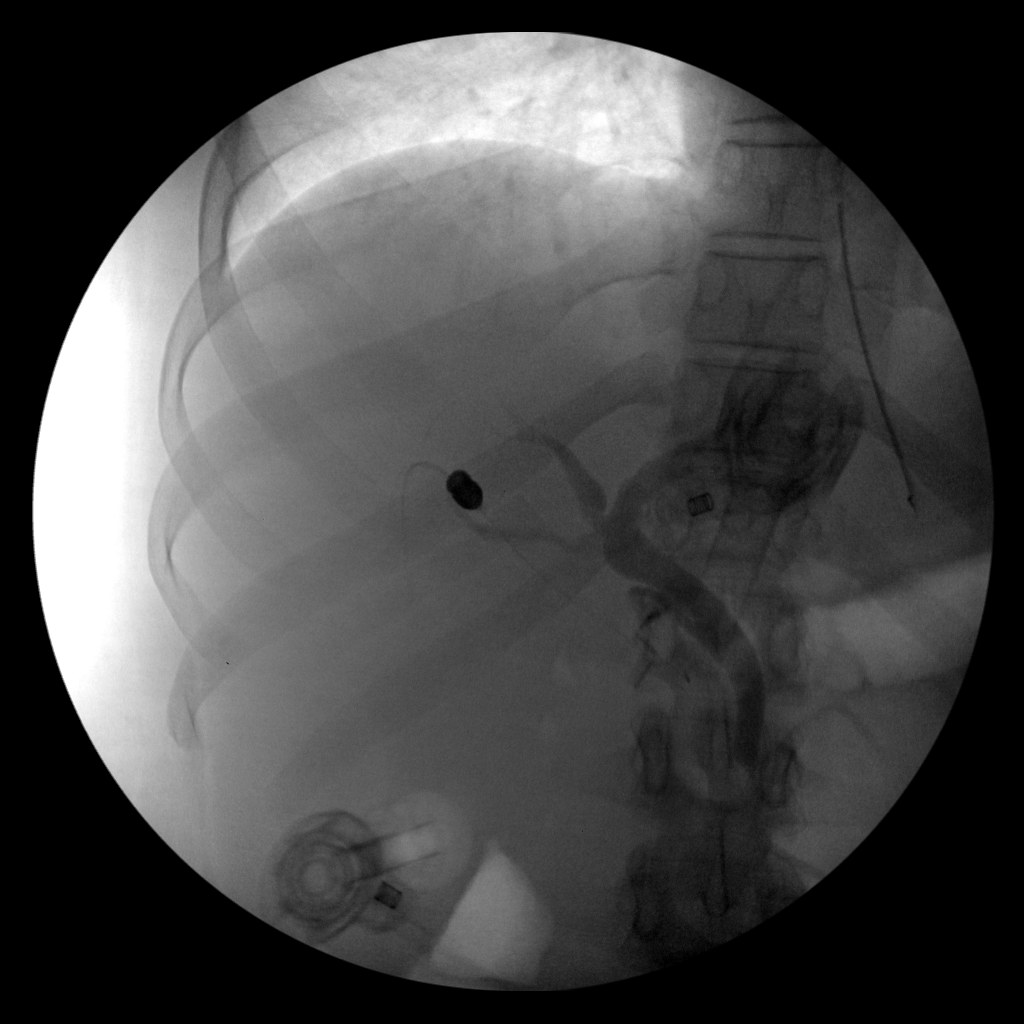
[im 1/2]
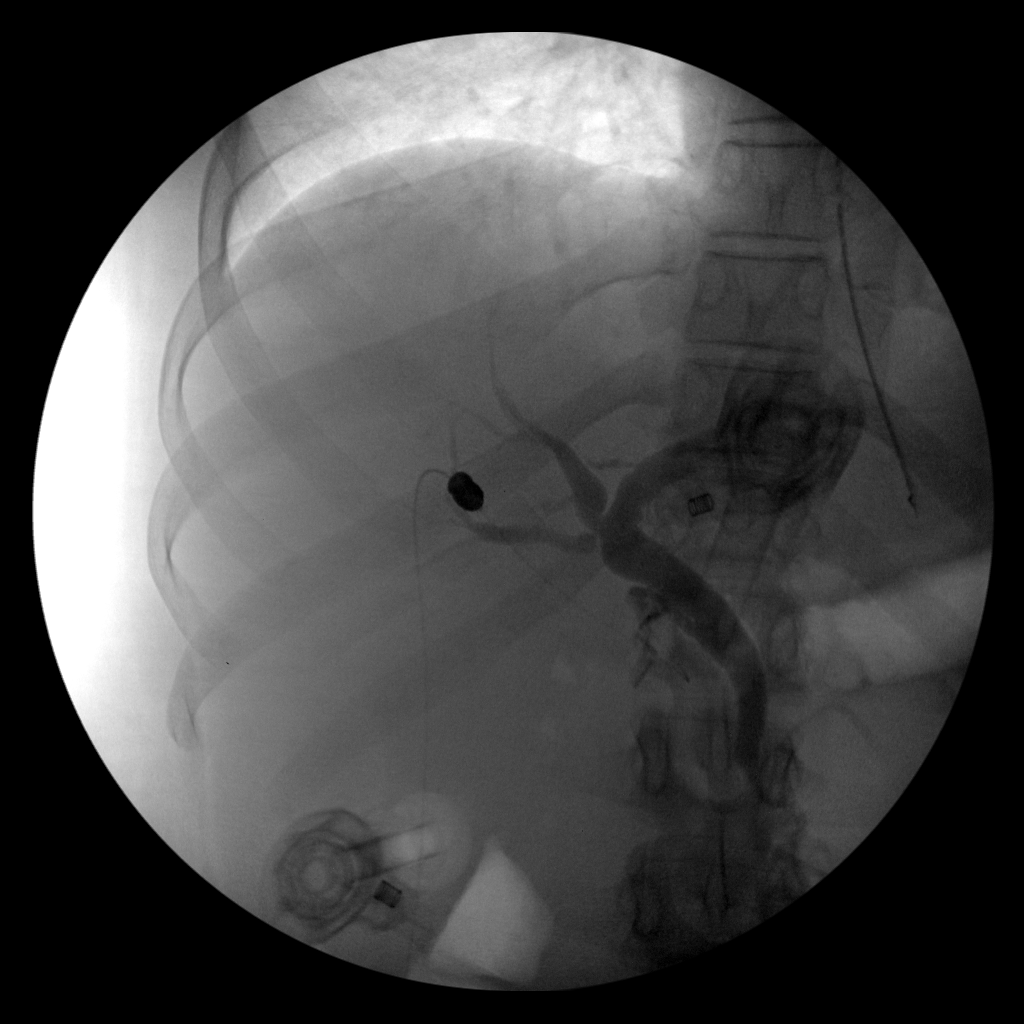
[im 1/2]
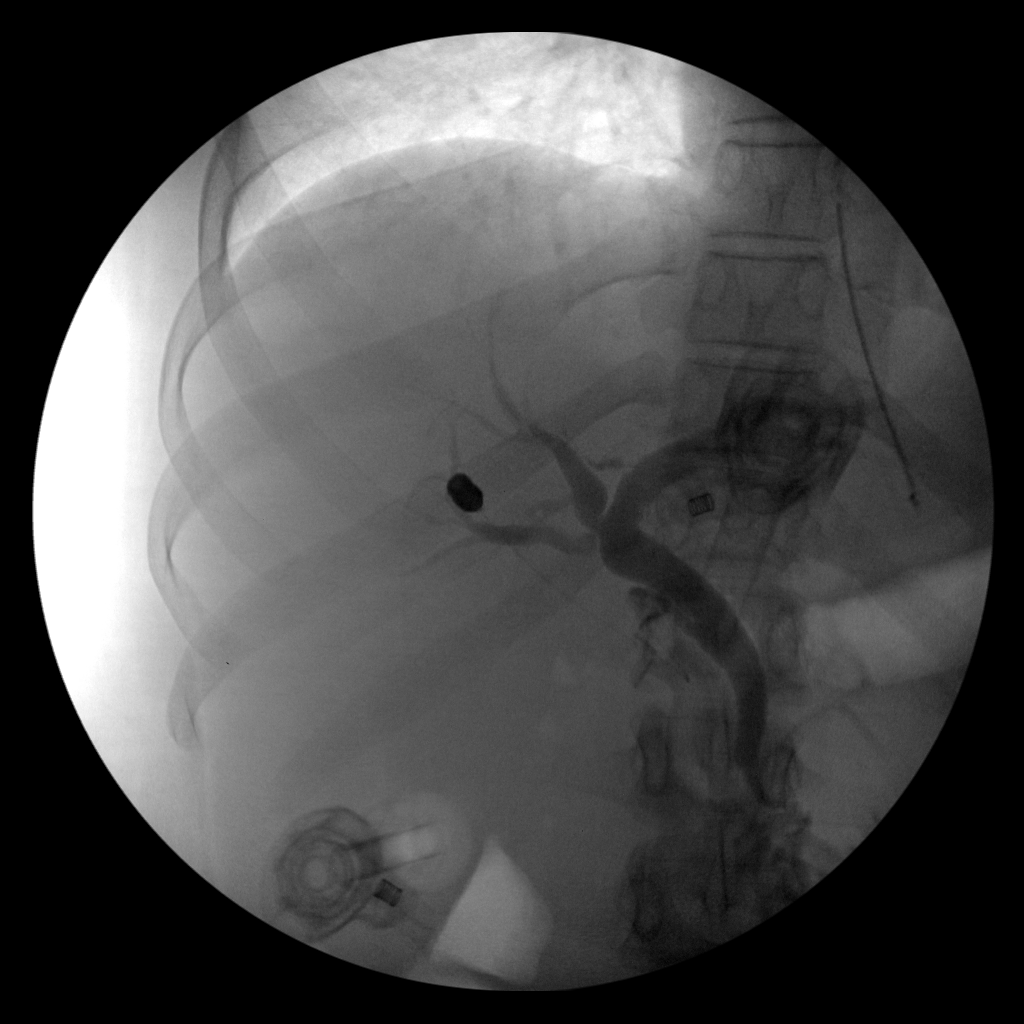
[im 1/2]
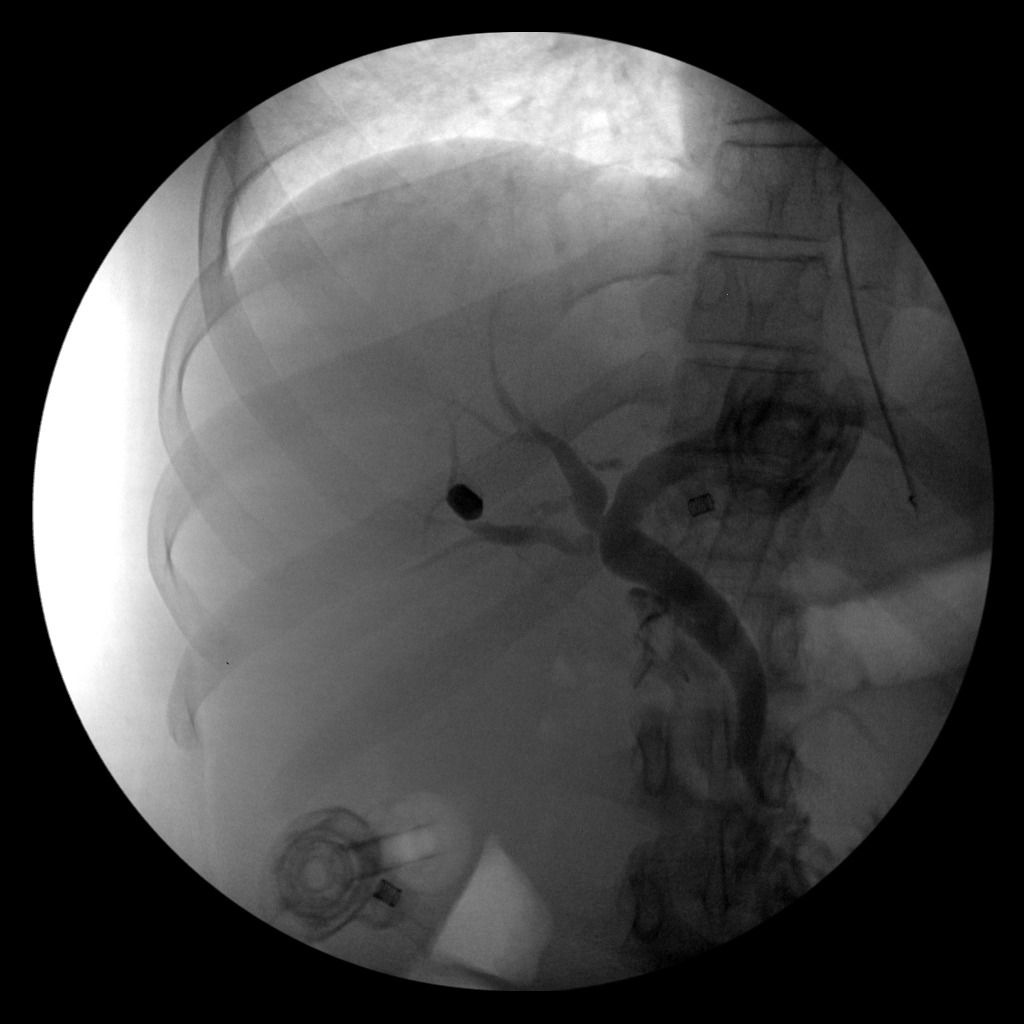
[im 2/2]
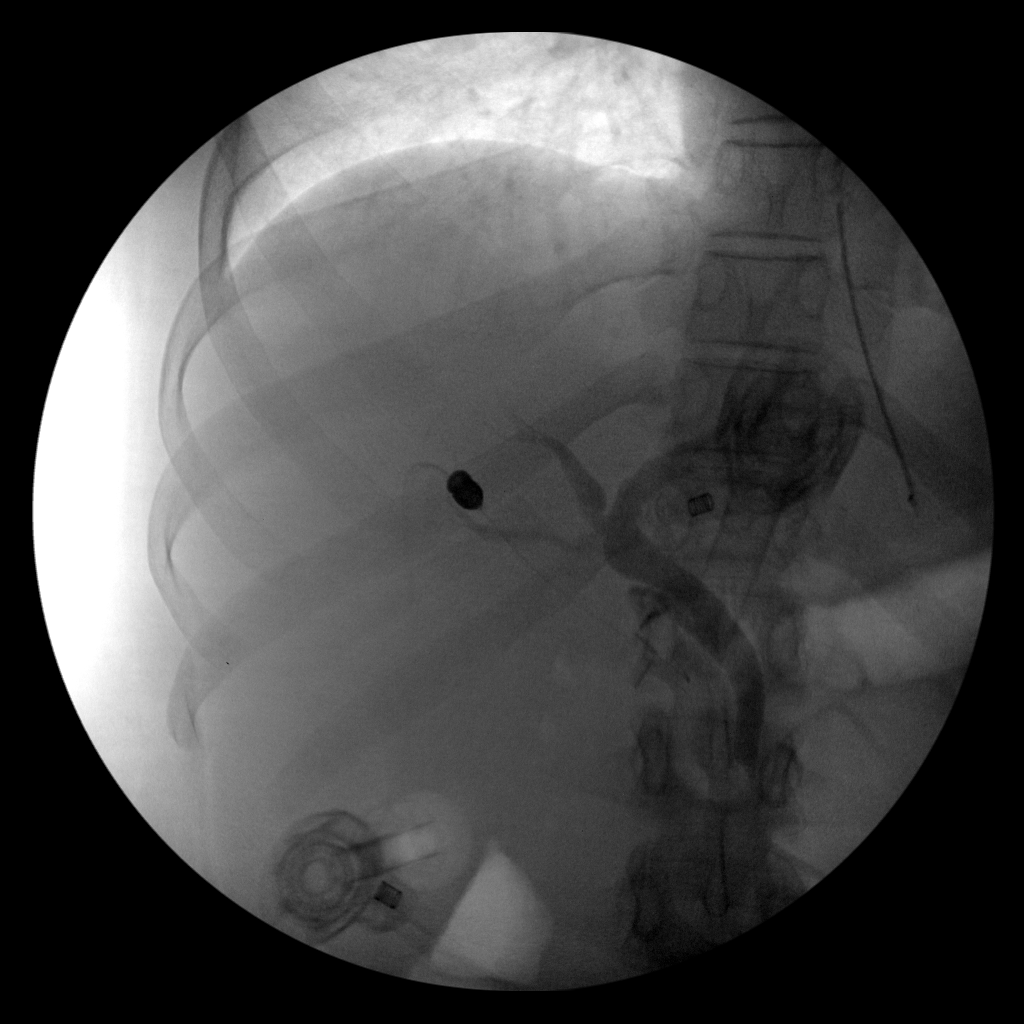

[5 of 5 positions shown; findings below may reference images not displayed]

FINDINGS: No persistent filling defects in the common duct. Intrahepatic ducts
are incompletely visualized, appearing decompressed centrally.
Contrast passes into the duodenum.

:
Negative for retained common duct stone.

## 2022-06-25 ENCOUNTER — Emergency Department (HOSPITAL_COMMUNITY)
Admission: EM | Admit: 2022-06-25 | Discharge: 2022-06-25 | Disposition: A | Discharge status: Home / Self Care | Payer: Medicaid Other | Attending: Student | Admitting: Student

## 2022-06-25 ENCOUNTER — Other Ambulatory Visit: Payer: Self-pay

## 2022-06-25 DIAGNOSIS — Z87891 Personal history of nicotine dependence: Secondary | ICD-10-CM | POA: Insufficient documentation

## 2022-06-25 DIAGNOSIS — R509 Fever, unspecified: Secondary | ICD-10-CM | POA: Insufficient documentation

## 2022-06-25 DIAGNOSIS — R112 Nausea with vomiting, unspecified: Secondary | ICD-10-CM | POA: Insufficient documentation

## 2022-06-25 DIAGNOSIS — N9489 Other specified conditions associated with female genital organs and menstrual cycle: Secondary | ICD-10-CM | POA: Insufficient documentation

## 2022-06-25 DIAGNOSIS — R197 Diarrhea, unspecified: Secondary | ICD-10-CM | POA: Insufficient documentation

## 2022-06-25 DIAGNOSIS — Z20822 Contact with and (suspected) exposure to covid-19: Secondary | ICD-10-CM | POA: Insufficient documentation

## 2022-06-25 DIAGNOSIS — R059 Cough, unspecified: Secondary | ICD-10-CM | POA: Insufficient documentation

## 2022-06-25 LAB — I-STAT BETA HCG BLOOD, ED (MC, WL, AP ONLY): I-stat hCG, quantitative: <5 m[IU]/mL (ref <5)

## 2022-06-25 LAB — COMPREHENSIVE METABOLIC PANEL
ALT: 22 U/L (ref 0–44)
AST: 18 U/L (ref 15–41)
Albumin: 3.7 g/dL (ref 3.5–5.0)
Alkaline Phosphatase: 70 U/L (ref 38–126)
Anion gap: 8 (ref 5–15)
BUN: <5 mg/dL — ABNORMAL LOW (ref 6–20)
CO2: 23 mmol/L (ref 22–32)
Calcium: 8.8 mg/dL — ABNORMAL LOW (ref 8.9–10.3)
Chloride: 107 mmol/L (ref 98–111)
Creatinine, Ser: 0.69 mg/dL (ref 0.44–1.00)
GFR, Estimated: >60 mL/min (ref >60)
Glucose, Bld: 102 mg/dL — ABNORMAL HIGH (ref 70–99)
Potassium: 3.7 mmol/L (ref 3.5–5.1)
Sodium: 138 mmol/L (ref 135–145)
Total Bilirubin: 0.6 mg/dL (ref 0.3–1.2)
Total Protein: 6.6 g/dL (ref 6.5–8.1)

## 2022-06-25 LAB — CBC
HCT: 38.8 % (ref 36.0–46.0)
Hemoglobin: 12.1 g/dL (ref 12.0–15.0)
MCH: 28.3 pg (ref 26.0–34.0)
MCHC: 31.2 g/dL (ref 30.0–36.0)
MCV: 90.9 fL (ref 80.0–100.0)
Platelets: 353 10*3/uL (ref 150–400)
RBC: 4.27 MIL/uL (ref 3.87–5.11)
RDW: 13.7 % (ref 11.5–15.5)
WBC: 6.7 10*3/uL (ref 4.0–10.5)
nRBC: 0 % (ref 0.0–0.2)

## 2022-06-25 LAB — RESP PANEL BY RT-PCR (FLU A&B, COVID) ARPGX2
Influenza A by PCR: NEGATIVE
Influenza B by PCR: NEGATIVE
SARS Coronavirus 2 by RT PCR: NEGATIVE

## 2022-06-25 MED ORDER — ONDANSETRON 4 MG PO TBDP: 4.0000 mg | ORAL_TABLET | Freq: Three times a day (TID) | ORAL | 0 refills | Status: AC | PRN

## 2022-06-25 MED ORDER — BENZONATATE 100 MG PO CAPS: 100.0000 mg | ORAL_CAPSULE | Freq: Three times a day (TID) | ORAL | 0 refills | Status: DC

## 2022-06-25 MED ORDER — BENZONATATE 100 MG PO CAPS
200.0000 mg | ORAL_CAPSULE | Freq: Once | ORAL | Status: AC
  Administered 2022-06-25: 200 mg via ORAL
  Filled 2022-06-25: qty 2

## 2022-06-25 MED ORDER — ONDANSETRON 4 MG PO TBDP
4.0000 mg | ORAL_TABLET | Freq: Once | ORAL | Status: AC
  Administered 2022-06-25: 4 mg via ORAL
  Filled 2022-06-25: qty 1

## 2022-06-25 MED ORDER — LOPERAMIDE HCL 2 MG PO CAPS
2.0000 mg | ORAL_CAPSULE | Freq: Once | ORAL | Status: AC
  Administered 2022-06-25: 2 mg via ORAL
  Filled 2022-06-25: qty 1

## 2022-06-25 MED ORDER — LOPERAMIDE HCL 2 MG PO CAPS: 2.0000 mg | ORAL_CAPSULE | ORAL | 0 refills | Status: AC | PRN

## 2022-06-25 NOTE — ED Triage Notes (Signed)
Pt to ED c/o nausea, vomiting, fever, cough, diarrhea x 3 days. Reports sick child in the house. Not taking OTC medications

## 2022-06-25 NOTE — ED Provider Triage Note (Signed)
Emergency Medicine Provider Triage Evaluation Note  Shelby Nichols , a 31 y.o. female  was evaluated in triage.  Pt complains of fever, vomiting, diarrhea, productive cough x 3 days. Has vomiting every time she has to have a BM.   Review of Systems  Positive: As above Negative: CP, SOB  Physical Exam  BP 132/80 (BP Location: Right Arm)   Pulse (!) 101   Temp 99.5 F (37.5 C) (Oral)   Resp 18   Ht 5' (1.524 m)   Wt 68 kg   SpO2 99%   BMI 29.29 kg/m  Gen:   Awake, no distress   Resp:  Normal effort  MSK:   Moves extremities without difficulty  Other:    Medical Decision Making  Medically screening exam initiated at 12:47 PM.  Appropriate orders placed.  Shelby Nichols was informed that the remainder of the evaluation will be completed by another provider, this initial triage assessment does not replace that evaluation, and the importance of remaining in the ED until their evaluation is complete.     Shelby Nichols T, PA-C 06/25/22 1248

## 2022-06-25 NOTE — ED Notes (Signed)
Patient verbalizes understanding of discharge instructions. Opportunity for questioning and answers were provided. Armband removed by staff, pt discharged from ED. Pt ambulatory to ED waiting room with steady gait.  

## 2022-06-25 NOTE — ED Provider Notes (Signed)
[facility] EMERGENCY DEPARTMENT Provider Note  CSN: [id number] Arrival date & time: 06/25/22 1213  Chief Complaint(s) Cough, Nausea, Diarrhea, Fever, and URI  HPI Shelby Nichols is a 31 y.o. female who presents emergency room for evaluation of multiple complaints including cough, nausea, vomiting, diarrhea, fever and chills.  Patient states that she has had symptoms for the last 3 days and is having difficulty tolerating p.o.  She states that she does have children at home who are sick last week but have since recovered.  Endorses persistent cough but denies chest pain, shortness of breath, headache or other systemic symptoms.   Past Medical History Past Medical History:  Diagnosis Date   Chlamydia    Trichomonas 04/25/2011   Patient Active Problem List   Diagnosis Date Noted   Leukocytosis 12/04/2017   Cholecystitis 12/02/2017   Abdominal pain 12/02/2017   Elevated transaminase level    Encounter for sterilization 07/22/2017   Preterm premature rupture of membranes 07/21/2017   SVD (spontaneous vaginal delivery) 07/21/2017   Supervision of normal pregnancy, antepartum 05/18/2017   NVD (normal vaginal delivery) 10/03/2015   Home Medication(s) Prior to Admission medications   Medication Sig Start Date End Date Taking? Authorizing Provider  acetaminophen (TYLENOL) 325 MG tablet You can buy this over the counter at any drug store.  Take 2 tablets every 4 hours as needed and alternate with ibuprofen and oxycodone as needed.DO NOT TAKE MORE THAN 4000 MG OF TYLENOL PER DAY.  IT CAN HARM YOUR LIVER. 12/04/17   Sherrie George, PA-C  ibuprofen (ADVIL,MOTRIN) 200 MG tablet You can take 2-3 tablets every 6 hours as needed for pain.  You can alternate with plain Tylenol or oxycodone.  You can buy this over the counter at any drug store. 12/04/17   Sherrie George, PA-C  oxyCODONE (OXY IR/ROXICODONE) 5 MG immediate release tablet Take 1-2 tablets (5-10 mg total) by mouth  every 6 (six) hours as needed for moderate pain, severe pain or breakthrough pain. 12/04/17   Sherrie George, PA-C  Prenat-Fe Poly-Methfol-FA-DHA (VITAFOL ULTRA) 29-0.6-0.4-200 MG CAPS Take 1 tablet by mouth daily. Patient not taking: Reported on 12/01/2017 03/07/17   Constant, Gigi Gin, MD                                                                                                                                    Past Surgical History Past Surgical History:  Procedure Laterality Date   CHOLECYSTECTOMY N/A 12/03/2017   Procedure: LAPAROSCOPIC CHOLECYSTECTOMY WITH INTRAOPERATIVE CHOLANGIOGRAM;  Surgeon: Ovidio Kin, MD;  Location: [facility];  Service: General;  Laterality: N/A;   NO PAST SURGERIES     TUBAL LIGATION Bilateral 07/22/2017   Procedure: POST PARTUM TUBAL LIGATION;  Surgeon: Willodean Rosenthal, MD;  Location: [facility] BIRTHING SUITES;  Service: Gynecology;  Laterality: Bilateral;   VAGINAL DELIVERY     Family History Family History  Problem Relation Age of Onset  Hypertension Mother    Cancer Neg Hx    Diabetes Neg Hx     Social History Social History   Tobacco Use   Smoking status: Former    Packs/day: 0.10    Years: 3.00    Total pack years: 0.30    Types: Cigarettes   Smokeless tobacco: Never  Vaping Use   Vaping Use: Never used  Substance Use Topics   Alcohol use: Yes    Comment: social, not while pregnant   Drug use: No   Allergies Patient has no known allergies.  Review of Systems Review of Systems  Respiratory:  Positive for cough.   Gastrointestinal:  Positive for diarrhea, nausea and vomiting.    Physical Exam Vital Signs  I have reviewed the triage vital signs BP 118/85   Pulse 75   Temp 99.3 F (37.4 C) (Oral)   Resp 16   Ht 5' (1.524 m)   Wt 68 kg   SpO2 98%   BMI 29.29 kg/m   Physical Exam Vitals and nursing note reviewed.  Constitutional:      General: She is not in acute distress.    Appearance: She is well-developed.  HENT:      Head: Normocephalic and atraumatic.  Eyes:     Conjunctiva/sclera: Conjunctivae normal.  Cardiovascular:     Rate and Rhythm: Normal rate and regular rhythm.     Heart sounds: No murmur heard. Pulmonary:     Effort: Pulmonary effort is normal. No respiratory distress.     Breath sounds: Normal breath sounds.  Abdominal:     Palpations: Abdomen is soft.     Tenderness: There is no abdominal tenderness.  Musculoskeletal:        General: No swelling.     Cervical back: Neck supple.  Skin:    General: Skin is warm and dry.     Capillary Refill: Capillary refill takes less than 2 seconds.  Neurological:     Mental Status: She is alert.  Psychiatric:        Mood and Affect: Mood normal.     ED Results and Treatments Labs (all labs ordered are listed, but only abnormal results are displayed) Labs Reviewed  COMPREHENSIVE METABOLIC PANEL - Abnormal; Notable for the following components:      Result Value   Glucose, Bld 102 (*)    BUN <5 (*)    Calcium 8.8 (*)    All other components within normal limits  RESP PANEL BY RT-PCR (FLU A&B, COVID) ARPGX2  CBC  URINALYSIS, ROUTINE W REFLEX MICROSCOPIC  I-STAT BETA HCG BLOOD, ED ([facility], [facility], [facility] ONLY)                                                                                                                          Radiology No results found.  Pertinent labs & imaging results that were available during my care of the patient were reviewed by me and considered in my medical  decision making (see MDM for details).  Medications Ordered in ED Medications - No data to display                                                                                                                                   Procedures Procedures  (including critical care time)  Medical Decision Making / ED Course   This patient presents to the ED for concern of cough, vomiting, diarrhea, this involves an extensive number of treatment options, and  is a complaint that carries with it a high risk of complications and morbidity.  The differential diagnosis includes COVID-19, influenza, viral gastroenteritis, gastroparesis  MDM: Patient seen and return for evaluation of multiple complaints described above.  Physical exam unremarkable.  Laboratory evaluation also reassuringly unremarkable.  COVID and flu negative.  Patient given Tessalon Perles, Zofran and patient was able to tolerate p.o. without difficulty.  We did discussion about loperamide use and I encouraged her to use it only if she is having more than 5 watery bowel movements a day.  Patient then discharged with outpatient follow-up.  Patient presentation consistent with viral gastroenteritis   Additional history obtained: -Additional history obtained from husband -External records from outside source obtained and reviewed including: Chart review including previous notes, labs, imaging, consultation notes   Lab Tests: -I ordered, reviewed, and interpreted labs.   The pertinent results include:   Labs Reviewed  COMPREHENSIVE METABOLIC PANEL - Abnormal; Notable for the following components:      Result Value   Glucose, Bld 102 (*)    BUN <5 (*)    Calcium 8.8 (*)    All other components within normal limits  RESP PANEL BY RT-PCR (FLU A&B, COVID) ARPGX2  CBC  URINALYSIS, ROUTINE W REFLEX MICROSCOPIC  I-STAT BETA HCG BLOOD, ED ([facility], [facility], [facility] ONLY)      Medicines ordered and prescription drug management: No orders of the defined types were placed in this encounter.   -I have reviewed the patients home medicines and have made adjustments as needed  Critical interventions none   Cardiac Monitoring: The patient was maintained on a cardiac monitor.  I personally viewed and interpreted the cardiac monitored which showed an underlying rhythm of: NSR  Social Determinants of Health:  Factors impacting patients care include: none   Reevaluation: After the interventions noted  above, I reevaluated the patient and found that they have :improved  Co morbidities that complicate the patient evaluation  Past Medical History:  Diagnosis Date   Chlamydia    Trichomonas 04/25/2011      Dispostion: I considered admission for this patient, but she does not meet inpatient criteria for admission she is safe for discharge with outpatient follow-up     Final Clinical Impression(s) / ED Diagnoses Final diagnoses:  None     @PCDICTATION @    , MD 06/25/22 1909

## 2022-12-02 ENCOUNTER — Other Ambulatory Visit: Payer: Self-pay

## 2022-12-02 ENCOUNTER — Emergency Department (HOSPITAL_COMMUNITY)
Admission: EM | Admit: 2022-12-02 | Discharge: 2022-12-03 | Disposition: A | Discharge status: Home / Self Care | Payer: Medicaid Other | Attending: Emergency Medicine | Admitting: Emergency Medicine

## 2022-12-02 ENCOUNTER — Emergency Department (HOSPITAL_COMMUNITY): Payer: Medicaid Other

## 2022-12-02 ENCOUNTER — Encounter (HOSPITAL_COMMUNITY): Payer: Self-pay | Admitting: *Deleted

## 2022-12-02 DIAGNOSIS — R002 Palpitations: Secondary | ICD-10-CM | POA: Insufficient documentation

## 2022-12-02 LAB — BASIC METABOLIC PANEL
Anion gap: 9 (ref 5–15)
BUN: 8 mg/dL (ref 6–20)
CO2: 25 mmol/L (ref 22–32)
Calcium: 8.9 mg/dL (ref 8.9–10.3)
Chloride: 107 mmol/L (ref 98–111)
Creatinine, Ser: 0.81 mg/dL (ref 0.44–1.00)
GFR, Estimated: >60 mL/min (ref >60)
Glucose, Bld: 84 mg/dL (ref 70–99)
Potassium: 3.8 mmol/L (ref 3.5–5.1)
Sodium: 141 mmol/L (ref 135–145)

## 2022-12-02 LAB — CBC
HCT: 36.3 % (ref 36.0–46.0)
Hemoglobin: 11.7 g/dL — ABNORMAL LOW (ref 12.0–15.0)
MCH: 28.6 pg (ref 26.0–34.0)
MCHC: 32.2 g/dL (ref 30.0–36.0)
MCV: 88.8 fL (ref 80.0–100.0)
Platelets: 330 10*3/uL (ref 150–400)
RBC: 4.09 MIL/uL (ref 3.87–5.11)
RDW: 13.3 % (ref 11.5–15.5)
WBC: 7.7 10*3/uL (ref 4.0–10.5)
nRBC: 0 % (ref 0.0–0.2)

## 2022-12-02 LAB — I-STAT BETA HCG BLOOD, ED (MC, WL, AP ONLY): I-stat hCG, quantitative: <5 m[IU]/mL (ref <5)

## 2022-12-02 LAB — TROPONIN I (HIGH SENSITIVITY): Troponin I (High Sensitivity): 3 ng/L (ref <18)

## 2022-12-02 NOTE — ED Triage Notes (Signed)
The pt has had an irregular pulse since yesterday  and her feet and legs are swollen  lmpnow

## 2022-12-03 LAB — TROPONIN I (HIGH SENSITIVITY): Troponin I (High Sensitivity): 2 ng/L (ref <18)

## 2022-12-03 NOTE — Discharge Instructions (Addendum)
Please follow-up with a cardiologist.  As we discussed, your heart rate has elevated several times while you are here in the emergency department.   Avoid caffeine, alcohol, please stop smoking Black and milds, make sure you drink plenty water

## 2022-12-03 NOTE — ED Provider Notes (Signed)
[place] EMERGENCY DEPARTMENT AT [facility] Provider Note   CSN: [id number] Arrival date & time: 12/02/22  2221     History  Chief Complaint  Patient presents with   Irregular Heart Beat    Shelby Nichols is a 32 y.o. female.  HPI  Patient is a 32 year old female with no pertinent past medical history does not take any medications, no history of recreational drug use such as cocaine or amphetamines, she drinks alcohol rarely perhaps 1 drink per week.  She states that since Thursday night she has had episodes where she felt that her heart was beating rapidly.  She denies any chest pain lightheadedness dizziness nausea or vomiting.  She denies any cough hemoptysis or unilateral leg swelling.  She states that she has noticed at the end of her workday that she has swelling in both legs but this is not new.  She states these episodes of palpitations seem to last only moments.  She states certainly less than a minute and perhaps only on the degree of seconds. She has no history of heart disease.     Home Medications Prior to Admission medications   Medication Sig Start Date End Date Taking? Authorizing Provider  acetaminophen (TYLENOL) 325 MG tablet You can buy this over the counter at any drug store.  Take 2 tablets every 4 hours as needed and alternate with ibuprofen and oxycodone as needed.DO NOT TAKE MORE THAN 4000 MG OF TYLENOL PER DAY.  IT CAN HARM YOUR LIVER. 12/04/17   Sherrie George, PA-C  benzonatate (TESSALON) 100 MG capsule Take 1 capsule (100 mg total) by mouth every 8 (eight) hours. 06/25/22   Kommor, Madison, MD  ibuprofen (ADVIL,MOTRIN) 200 MG tablet You can take 2-3 tablets every 6 hours as needed for pain.  You can alternate with plain Tylenol or oxycodone.  You can buy this over the counter at any drug store. 12/04/17   Sherrie George, PA-C  loperamide (IMODIUM) 2 MG capsule Take 1 capsule (2 mg total) by mouth as needed for diarrhea or loose stools.  06/25/22   Kommor, Madison, MD  ondansetron (ZOFRAN-ODT) 4 MG disintegrating tablet Take 1 tablet (4 mg total) by mouth every 8 (eight) hours as needed for nausea or vomiting. 06/25/22   Kommor, Madison, MD  oxyCODONE (OXY IR/ROXICODONE) 5 MG immediate release tablet Take 1-2 tablets (5-10 mg total) by mouth every 6 (six) hours as needed for moderate pain, severe pain or breakthrough pain. 12/04/17   Sherrie George, PA-C  Prenat-Fe Poly-Methfol-FA-DHA (VITAFOL ULTRA) 29-0.6-0.4-200 MG CAPS Take 1 tablet by mouth daily. Patient not taking: Reported on 12/01/2017 03/07/17   Constant, Peggy, MD      Allergies    Patient has no known allergies.    Review of Systems   Review of Systems  Physical Exam Updated Vital Signs BP 100/81   Pulse 60   Temp 98.7 F (37.1 C) (Oral)   Resp 18   Ht 5' (1.524 m)   Wt 68 kg   LMP 12/02/2022   SpO2 100%   BMI 29.28 kg/m  Physical Exam Vitals and nursing note reviewed.  Constitutional:      General: She is not in acute distress. HENT:     Head: Normocephalic and atraumatic.     Nose: Nose normal.  Eyes:     General: No scleral icterus. Cardiovascular:     Rate and Rhythm: Normal rate and regular rhythm.     Pulses: Normal pulses.  Pulmonary:  Effort: Pulmonary effort is normal. No respiratory distress.     Breath sounds: No wheezing.  Abdominal:     Palpations: Abdomen is soft.     Tenderness: There is no abdominal tenderness. There is no guarding or rebound.  Musculoskeletal:     Cervical back: Normal range of motion.     Right lower leg: No edema.     Left lower leg: No edema.     Comments: Lower extremities without pitting edema.  Legs are symmetric  Skin:    General: Skin is warm and dry.     Capillary Refill: Capillary refill takes less than 2 seconds.  Neurological:     Mental Status: She is alert. Mental status is at baseline.  Psychiatric:        Mood and Affect: Mood normal.        Behavior: Behavior normal.     ED  Results / Procedures / Treatments   Labs (all labs ordered are listed, but only abnormal results are displayed) Labs Reviewed  CBC - Abnormal; Notable for the following components:      Result Value   Hemoglobin 11.7 (*)    All other components within normal limits  BASIC METABOLIC PANEL  I-STAT BETA HCG BLOOD, ED ([facility], [facility], [facility] ONLY)  I-STAT BETA HCG BLOOD, ED ([facility], [facility], [facility] ONLY)  TROPONIN I (HIGH SENSITIVITY)  TROPONIN I (HIGH SENSITIVITY)    EKG None  Radiology DG Chest 2 View  Result Date: 12/02/2022 CLINICAL DATA:  Palpitations. EXAM: CHEST - 2 VIEW COMPARISON:  Dec 02, 2017 FINDINGS: The heart size and mediastinal contours are within normal limits. Both lungs are clear. Radiopaque surgical clips are seen within the right upper quadrant. The visualized skeletal structures are unremarkable. IMPRESSION: No active cardiopulmonary disease. Electronically Signed   By: Aram Candela M.D.   On: 12/02/2022 23:43    Procedures Procedures    Medications Ordered in ED Medications - No data to display  ED Course/ Medical Decision Making/ A&P Clinical Course as of 12/03/22 0309  Sun Dec 03, 2022  0208 Last ep 1 hour ago [WF]  0208 On the 9th in the evening.  She started having episodes every 30 min where she feels an irregular HR/palp.   Drinks rarely. Smoked a black and mild 2-3 per day foryears.   No RDU.    [WF]    Clinical Course User Index [WF] Gailen Shelter, [place]                             Medical Decision Making   This patient presents to the ED for concern of palpitations, this involves a number of treatment options, and is a complaint that carries with it a moderate risk of complications and morbidity. A differential diagnosis was considered for the patient's symptoms which is discussed below:   The differential diagnosis for palpitations includes cardiac arrhythmias, PVC/PAC, ACS, Cardiomyopathy, CHF, MVP, pericarditis, valvular disease, Panic/Anxiety, Somatic  disorder, ETOH, Caffeine,  Stimulant use, medication side effect, Anemia, Hyperthyroidism, pulmonary embolism.    Co morbidities: Discussed in HPI   Brief History:  Patient is a 32 year old female with no pertinent past medical history does not take any medications, no history of recreational drug use such as cocaine or amphetamines, she drinks alcohol rarely perhaps 1 drink per week.  She states that since Thursday night she has had episodes where she felt that her heart was beating rapidly.  She denies any chest pain lightheadedness dizziness nausea or vomiting.  She denies any cough hemoptysis or unilateral leg swelling.  She states that she has noticed at the end of her workday that she has swelling in both legs but this is not new.  She states these episodes of palpitations seem to last only moments.  She states certainly less than a minute and perhaps only on the degree of seconds. She has no history of heart disease.    EMR reviewed including pt PMHx, past surgical history and past visits to ER.   See HPI for more details   Lab Tests:  BMP unremarkable, CBC without clinically significant anemia, troponin x 1 within normal limits, hCG negative for pregnancy   Imaging Studies:  Chest x-ray unremarkable    Cardiac Monitoring:  The patient was maintained on a cardiac monitor.  I personally viewed and interpreted the cardiac monitored which showed an underlying rhythm of: NSR EKG non-ischemic Initial EKG with downward facing P waves, repeat EKG shows NSR.  I reviewed telemetry monitor and found episodes of a narrow complex tachycardia see picture below.  Differential includes a flutter, sinus tachycardia, less likely SVT given rate seems to be approximately 140    Medicines ordered:     Critical Interventions:     Consults/Attending Physician   I discussed this case with my attending physician who cosigned this note including patient's presenting symptoms,  physical exam, and planned diagnostics and interventions. Attending physician stated agreement with plan or made changes to plan which were implemented.   Reevaluation:  After the interventions noted above I re-evaluated patient and found that they have :stayed the same   Social Determinants of Health:  The patient's social determinants of health were a factor in the care of this patient    Problem List / ED Course:  Patient with very brief episodes of narrow complex tachycardia more likely a flutter versus sinus tachycardia.  She is not using any progression of drugs no history of IV alcohol use.  CHA2DS2-VASc score 0.  Patient is well-appearing with normal vital signs.  No chest pain or lightheadedness or dizziness.  Reassuring workup here in the ER.  Will refer to cardiology.  She may benefit from [facility] patch or additional workup.  I do not believe that she will benefit from TSH here in the emergency department given that her symptoms are paroxysmal and not to be consistent she has no sweating, hyperthermia, anxiety symptoms or other indicators of hyperthyroidism.   Dispostion:  After consideration of the diagnostic results and the patients response to treatment, I feel that the patent would benefit from close outpatient follow-up.  Final Clinical Impression(s) / ED Diagnoses Final diagnoses:  Palpitations    Rx / DC Orders ED Discharge Orders          Ordered    Ambulatory referral to Cardiology       Comments: If you have not heard from the Cardiology office within the next 72 hours please call [phone].   12/03/22 0305              Gailen Shelter, PA 12/03/22 0981    Gilda Crease, MD 12/04/22 [phone]

## 2023-03-01 ENCOUNTER — Ambulatory Visit: Payer: Medicaid Other | Admitting: Family Medicine

## 2023-03-01 ENCOUNTER — Ambulatory Visit (INDEPENDENT_AMBULATORY_CARE_PROVIDER_SITE_OTHER): Payer: Medicaid Other | Admitting: Family Medicine

## 2023-03-01 ENCOUNTER — Encounter: Payer: Self-pay | Admitting: Family Medicine

## 2023-03-01 ENCOUNTER — Other Ambulatory Visit (HOSPITAL_COMMUNITY)
Admission: RE | Admit: 2023-03-01 | Discharge: 2023-03-01 | Disposition: A | Discharge status: Home / Self Care | Payer: Medicaid Other | Source: Ambulatory Visit | Attending: Family Medicine | Admitting: Family Medicine

## 2023-03-01 VITALS — BP 109/69 | HR 69 | Ht 60.0 in | Wt 212.6 lb

## 2023-03-01 DIAGNOSIS — Z124 Encounter for screening for malignant neoplasm of cervix: Secondary | ICD-10-CM

## 2023-03-01 DIAGNOSIS — Z01419 Encounter for gynecological examination (general) (routine) without abnormal findings: Secondary | ICD-10-CM

## 2023-03-01 DIAGNOSIS — Z113 Encounter for screening for infections with a predominantly sexual mode of transmission: Secondary | ICD-10-CM | POA: Diagnosis not present

## 2023-03-01 DIAGNOSIS — B372 Candidiasis of skin and nail: Secondary | ICD-10-CM | POA: Diagnosis not present

## 2023-03-01 MED ORDER — CLOTRIMAZOLE 1 % EX CREA
1.0000 | TOPICAL_CREAM | Freq: Two times a day (BID) | CUTANEOUS | 0 refills | Status: AC
Start: 2023-03-01 — End: ?

## 2023-03-01 NOTE — Progress Notes (Signed)
Pt. Presents for AEX request for PAP and STD testing Check rash on inner thighs, has been present since last year.

## 2023-03-01 NOTE — Addendum Note (Signed)
Addended by: Natale Milch D on: 03/01/2023 03:54 PM   Modules accepted: Orders

## 2023-03-01 NOTE — Progress Notes (Signed)
GYNECOLOGY OFFICE VISIT NOTE  History:   Shelby Nichols is a 32 y.o. [phone] here today for a routine annual gyn exam. She denies any abnormal vaginal discharge, bleeding, pelvic pain. She reports a rash in her inner thigh, present for the last 8 months and very itchy. She has not tried any medications for this.  She would like STI testing.   Past Medical History:  Diagnosis Date   Chlamydia    Trichomonas 04/25/2011    Past Surgical History:  Procedure Laterality Date   CHOLECYSTECTOMY N/A 12/03/2017   Procedure: LAPAROSCOPIC CHOLECYSTECTOMY WITH INTRAOPERATIVE CHOLANGIOGRAM;  Surgeon: Ovidio Kin, MD;  Location: [facility];  Service: General;  Laterality: N/A;   NO PAST SURGERIES     TUBAL LIGATION Bilateral 07/22/2017   Procedure: POST PARTUM TUBAL LIGATION;  Surgeon: Willodean Rosenthal, MD;  Location: [facility] BIRTHING SUITES;  Service: Gynecology;  Laterality: Bilateral;   VAGINAL DELIVERY      The following portions of the patient's history were reviewed and updated as appropriate: allergies, current medications, past family history, past medical history, past social history, past surgical history and problem list.   Health Maintenance:  Normal pap about 6 years ago. And due today. No significant family history of breast or colon cancer. Not due for screening due to age.  Review of Systems:  Pertinent items noted in HPI and remainder of comprehensive ROS otherwise negative.  Physical Exam:  BP 109/69   Pulse 69   Ht 5' (1.524 m)   Wt 212 lb 9.6 oz (96.4 kg)   BMI 41.52 kg/m  CONSTITUTIONAL: Well-developed, well-nourished female in no acute distress.  HEENT:  Normocephalic, atraumatic. External right and left ear normal. No scleral icterus.  NECK: Normal range of motion, supple, no masses noted on observation SKIN: hyperpigmented macular scaly rash noted around inner thighs and genitocrural area. Few satellite lesions.  MUSCULOSKELETAL: Normal range of motion. No edema  noted. NEUROLOGIC: Alert and oriented to person, place, and time. Normal muscle tone coordination. No cranial nerve deficit noted. PSYCHIATRIC: Normal mood and affect. Normal behavior. Normal judgment and thought content. CARDIOVASCULAR: Normal heart rate noted RESPIRATORY: Effort and breath sounds normal, no problems with respiration noted ABDOMEN: No masses noted. No other overt distention noted.   PELVIC: Normal appearing external genitalia; normal urethral meatus; normal appearing vaginal mucosa and cervix.  Scant Aubert discharge noted.  Normal uterine size, no other palpable masses, no uterine or adnexal tenderness. Performed in the presence of a chaperone  Labs and Imaging No results found for this or any previous visit (from the past 168 hour(s)). No results found.    Assessment and Plan:  Encounter for annual routine gynecological examination Doing well overall. Pap smear collected today. Discussed keeping healthy.  Cervical cancer screening -     Cervicovaginal ancillary only  Screening examination for STI -     Cervicovaginal ancillary only -     HIV Antibody (routine testing w rflx) -     RPR -     Hepatitis C antibody -     Hepatitis B surface antigen  Intertrigo of genitocrural region due to Candida species Ddx include dermatitis. However less likely, given location. Will treat with antifungal cream.  -     Clotrimazole; Apply 1 Application topically 2 (two) times daily. Apply to affected area in inner thigh  Dispense: 40 g; Refill: 0  Routine preventative health maintenance measures emphasized. Please refer to After Visit Summary for other counseling recommendations.  No follow-ups on file.    I spent 25 minutes dedicated to the care of this patient including pre-visit review of records, face to face time with the patient discussing her conditions and treatments and post visit orders.  Sheppard Evens MD MPH OB Fellow, Faculty Practice [facility], Center for  [facility] 03/01/2023

## 2023-03-01 NOTE — Addendum Note (Signed)
Addended by: Alfredia Ferguson on: 03/01/2023 03:52 PM   Modules accepted: Orders

## 2024-06-24 ENCOUNTER — Other Ambulatory Visit: Payer: Self-pay

## 2024-06-24 ENCOUNTER — Emergency Department (HOSPITAL_COMMUNITY): Payer: MEDICAID

## 2024-06-24 ENCOUNTER — Emergency Department (HOSPITAL_COMMUNITY)
Admission: EM | Admit: 2024-06-24 | Discharge: 2024-06-24 | Disposition: A | Discharge status: Home / Self Care | Payer: MEDICAID

## 2024-06-24 DIAGNOSIS — J181 Lobar pneumonia, unspecified organism: Secondary | ICD-10-CM | POA: Insufficient documentation

## 2024-06-24 DIAGNOSIS — R5383 Other fatigue: Secondary | ICD-10-CM | POA: Diagnosis not present

## 2024-06-24 DIAGNOSIS — J189 Pneumonia, unspecified organism: Secondary | ICD-10-CM

## 2024-06-24 DIAGNOSIS — R918 Other nonspecific abnormal finding of lung field: Secondary | ICD-10-CM | POA: Diagnosis not present

## 2024-06-24 DIAGNOSIS — R062 Wheezing: Secondary | ICD-10-CM | POA: Diagnosis not present

## 2024-06-24 DIAGNOSIS — R0602 Shortness of breath: Secondary | ICD-10-CM | POA: Diagnosis not present

## 2024-06-24 DIAGNOSIS — J168 Pneumonia due to other specified infectious organisms: Secondary | ICD-10-CM | POA: Insufficient documentation

## 2024-06-24 DIAGNOSIS — R052 Subacute cough: Secondary | ICD-10-CM

## 2024-06-24 DIAGNOSIS — J984 Other disorders of lung: Secondary | ICD-10-CM | POA: Diagnosis not present

## 2024-06-24 LAB — URINALYSIS, ROUTINE W REFLEX MICROSCOPIC
Bilirubin Urine: NEGATIVE
Glucose, UA: NEGATIVE mg/dL
Hgb urine dipstick: NEGATIVE
Ketones, ur: 5 mg/dL — AB
Leukocytes,Ua: NEGATIVE
Nitrite: NEGATIVE
Protein, ur: NEGATIVE mg/dL
Specific Gravity, Urine: 1.018 (ref 1.005–1.030)
pH: 6 (ref 5.0–8.0)

## 2024-06-24 LAB — RESP PANEL BY RT-PCR (RSV, FLU A&B, COVID)  RVPGX2
Influenza A by PCR: NEGATIVE
Influenza B by PCR: NEGATIVE
Resp Syncytial Virus by PCR: NEGATIVE
SARS Coronavirus 2 by RT PCR: NEGATIVE

## 2024-06-24 LAB — COMPREHENSIVE METABOLIC PANEL WITH GFR
ALT: 31 U/L (ref 0–44)
AST: 35 U/L (ref 15–41)
Albumin: 2.9 g/dL — ABNORMAL LOW (ref 3.5–5.0)
Alkaline Phosphatase: 61 U/L (ref 38–126)
Anion gap: 11 (ref 5–15)
BUN: 7 mg/dL (ref 6–20)
CO2: 23 mmol/L (ref 22–32)
Calcium: 8.4 mg/dL — ABNORMAL LOW (ref 8.9–10.3)
Chloride: 102 mmol/L (ref 98–111)
Creatinine, Ser: 0.8 mg/dL (ref 0.44–1.00)
GFR, Estimated: >60 mL/min (ref >60)
Glucose, Bld: 111 mg/dL — ABNORMAL HIGH (ref 70–99)
Potassium: 3.8 mmol/L (ref 3.5–5.1)
Sodium: 136 mmol/L (ref 135–145)
Total Bilirubin: 0.5 mg/dL (ref 0.0–1.2)
Total Protein: 6.8 g/dL (ref 6.5–8.1)

## 2024-06-24 LAB — CBC
HCT: 37.8 % (ref 36.0–46.0)
Hemoglobin: 12.3 g/dL (ref 12.0–15.0)
MCH: 27.1 pg (ref 26.0–34.0)
MCHC: 32.5 g/dL (ref 30.0–36.0)
MCV: 83.3 fL (ref 80.0–100.0)
Platelets: 377 K/uL (ref 150–400)
RBC: 4.54 MIL/uL (ref 3.87–5.11)
RDW: 13.2 % (ref 11.5–15.5)
WBC: 8 K/uL (ref 4.0–10.5)
nRBC: 0 % (ref 0.0–0.2)

## 2024-06-24 LAB — HCG, SERUM, QUALITATIVE: Preg, Serum: NEGATIVE

## 2024-06-24 LAB — LIPASE, BLOOD: Lipase: 24 U/L (ref 11–51)

## 2024-06-24 MED ORDER — AZITHROMYCIN 250 MG PO TABS: 250.0000 mg | ORAL_TABLET | Freq: Every day | ORAL | 0 refills | Status: DC

## 2024-06-24 MED ORDER — ALBUTEROL SULFATE HFA 108 (90 BASE) MCG/ACT IN AERS: 1.0000 | INHALATION_SPRAY | Freq: Four times a day (QID) | RESPIRATORY_TRACT | 0 refills | Status: AC | PRN

## 2024-06-24 MED ORDER — ONDANSETRON HCL 4 MG PO TABS: 4.0000 mg | ORAL_TABLET | Freq: Four times a day (QID) | ORAL | 0 refills | Status: AC

## 2024-06-24 MED ORDER — GUAIFENESIN 100 MG/5ML PO LIQD: 100.0000 mg | ORAL | 0 refills | Status: DC | PRN

## 2024-06-24 MED ORDER — IPRATROPIUM-ALBUTEROL 0.5-2.5 (3) MG/3ML IN SOLN
3.0000 mL | Freq: Once | RESPIRATORY_TRACT | Status: AC
  Administered 2024-06-24: 3 mL via RESPIRATORY_TRACT
  Filled 2024-06-24: qty 3

## 2024-06-24 MED ORDER — AMOXICILLIN-POT CLAVULANATE 875-125 MG PO TABS: 1.0000 | ORAL_TABLET | Freq: Two times a day (BID) | ORAL | 0 refills | Status: AC

## 2024-06-24 MED ORDER — SODIUM CHLORIDE 0.9 % IV BOLUS
1000.0000 mL | Freq: Once | INTRAVENOUS | Status: AC
  Administered 2024-06-24: 1000 mL via INTRAVENOUS

## 2024-06-24 MED ORDER — BENZONATATE 100 MG PO CAPS: 100.0000 mg | ORAL_CAPSULE | Freq: Three times a day (TID) | ORAL | 0 refills | Status: DC

## 2024-06-24 NOTE — Discharge Instructions (Addendum)
 Take the prescriptions as prescribed.  If anything changes come back to the ED.   If you have any kind of worsening shortness of breath or fever or coughing up blood then please come back to the ED.  Otherwise, I think you will improve with time.

## 2024-06-24 NOTE — ED Triage Notes (Signed)
 Pt. Stated, I've had N/V/D with coughing and have no energy for a week.

## 2024-06-24 NOTE — ED Provider Notes (Signed)
 [place] EMERGENCY DEPARTMENT AT [facility] Provider Note   CSN: [id number] Arrival date & time: 06/24/24  9148     Patient presents with: Cough, Emesis, Nausea, Diarrhea, and Fatigue   Shelby Nichols is a 33 y.o. female.    Cough Emesis Associated symptoms: cough and diarrhea   Diarrhea Associated symptoms: vomiting      Presents because of cough, present for the past week.  Diarrhea.  Nausea.  Patient states sometimes the cough, and present for the past week.  Nonproductive in nature.  No fever no chills but sometimes feels slightly short of breath with exertion but no pleuritic chest pain or hemoptysis.  Denies any history of DVT or PE.  She states that she is having a nonproductive cough.  Some slight postnasal drip.  Started with some nausea and vomiting yesterday.  Last episode emesis last night.  Last episode of diarrhea last night as well.  No sick contacts that she is aware of.  Denies any abdominal pain.  No melena.  No hematochezia.   Previous medical history reviewed : Patient was last seen in the ED in May 2024.  Palpitations.  Negative workup at that time.   Prior to Admission medications   Medication Sig Start Date End Date Taking? Authorizing Provider  albuterol  (VENTOLIN  HFA) 108 (90 Base) MCG/ACT inhaler Inhale 1-2 puffs into the lungs every 6 (six) hours as needed for wheezing or shortness of breath. 06/24/24  Yes Simon Lavonia SAILOR, MD  amoxicillin -clavulanate (AUGMENTIN ) 875-125 MG tablet Take 1 tablet by mouth every 12 (twelve) hours. 06/24/24  Yes Simon Lavonia SAILOR, MD  azithromycin  (ZITHROMAX ) 250 MG tablet Take 1 tablet (250 mg total) by mouth daily. Take first 2 tablets together, then 1 every day until finished. 06/24/24  Yes Simon Lavonia SAILOR, MD  benzonatate  (TESSALON ) 100 MG capsule Take 1 capsule (100 mg total) by mouth every 8 (eight) hours. 06/24/24  Yes Simon Lavonia SAILOR, MD  guaiFENesin  (ROBITUSSIN) 100 MG/5ML liquid Take 5-10 mLs (100-200 mg total) by  mouth every 4 (four) hours as needed for cough or to loosen phlegm. 06/24/24  Yes Simon Lavonia SAILOR, MD  ondansetron  (ZOFRAN ) 4 MG tablet Take 1 tablet (4 mg total) by mouth every 6 (six) hours. 06/24/24  Yes Simon Lavonia SAILOR, MD  acetaminophen  (TYLENOL ) 325 MG tablet You can buy this over the counter at any drug store.  Take 2 tablets every 4 hours as needed and alternate with ibuprofen  and oxycodone  as needed.DO NOT TAKE MORE THAN 4000 MG OF TYLENOL  PER DAY.  IT CAN HARM YOUR LIVER. Patient not taking: Reported on 03/01/2023 12/04/17   Tonnie George, PA-C  benzonatate  (TESSALON ) 100 MG capsule Take 1 capsule (100 mg total) by mouth every 8 (eight) hours. Patient not taking: Reported on 03/01/2023 06/25/22   Kommor, Lum, MD  clotrimazole  (CLOTRIMAZOLE  ANTI-FUNGAL) 1 % cream Apply 1 Application topically 2 (two) times daily. Apply to affected area in inner thigh 03/01/23   Ndulue, Chiagoziem J, MD  ibuprofen  (ADVIL ,MOTRIN ) 200 MG tablet You can take 2-3 tablets every 6 hours as needed for pain.  You can alternate with plain Tylenol  or oxycodone .  You can buy this over the counter at any drug store. Patient not taking: Reported on 03/01/2023 12/04/17   Tonnie George, PA-C  loperamide  (IMODIUM ) 2 MG capsule Take 1 capsule (2 mg total) by mouth as needed for diarrhea or loose stools. Patient not taking: Reported on 03/01/2023 06/25/22   Kommor, [place],  MD  ondansetron  (ZOFRAN -ODT) 4 MG disintegrating tablet Take 1 tablet (4 mg total) by mouth every 8 (eight) hours as needed for nausea or vomiting. Patient not taking: Reported on 03/01/2023 06/25/22   Kommor, Lum, MD  oxyCODONE  (OXY IR/ROXICODONE ) 5 MG immediate release tablet Take 1-2 tablets (5-10 mg total) by mouth every 6 (six) hours as needed for moderate pain, severe pain or breakthrough pain. Patient not taking: Reported on 03/01/2023 12/04/17   Tonnie George, PA-C  Prenat-Fe Poly-Methfol-FA-DHA (VITAFOL  ULTRA) 29-0.6-0.4-200 MG CAPS Take 1 tablet by  mouth daily. Patient not taking: Reported on 12/01/2017 03/07/17   Constant, Peggy, MD    Allergies: Patient has no known allergies.    Review of Systems  Respiratory:  Positive for cough.   Gastrointestinal:  Positive for diarrhea and vomiting.    Updated Vital Signs BP 106/63   Pulse 98   Temp 99.5 F (37.5 C) (Oral)   Resp 20   Ht 5' (1.524 m)   Wt 104.3 kg   LMP 06/10/2024   SpO2 97%   BMI 44.92 kg/m   Physical Exam Vitals and nursing note reviewed.  Constitutional:      General: She is not in acute distress.    Appearance: She is well-developed.  HENT:     Head: Normocephalic and atraumatic.  Eyes:     Conjunctiva/sclera: Conjunctivae normal.  Cardiovascular:     Rate and Rhythm: Normal rate and regular rhythm.     Heart sounds: No murmur heard. Pulmonary:     Effort: Pulmonary effort is normal. No respiratory distress.     Breath sounds: Normal breath sounds.  Abdominal:     Palpations: Abdomen is soft.     Tenderness: There is no abdominal tenderness.  Musculoskeletal:        General: No swelling.     Cervical back: Neck supple.  Skin:    General: Skin is warm and dry.     Capillary Refill: Capillary refill takes less than 2 seconds.  Neurological:     Mental Status: She is alert.  Psychiatric:        Mood and Affect: Mood normal.     (all labs ordered are listed, but only abnormal results are displayed) Labs Reviewed  COMPREHENSIVE METABOLIC PANEL WITH GFR - Abnormal; Notable for the following components:      Result Value   Glucose, Bld 111 (*)    Calcium 8.4 (*)    Albumin 2.9 (*)    All other components within normal limits  URINALYSIS, ROUTINE W REFLEX MICROSCOPIC - Abnormal; Notable for the following components:   APPearance HAZY (*)    Ketones, ur 5 (*)    All other components within normal limits  RESP PANEL BY RT-PCR (RSV, FLU A&B, COVID)  RVPGX2  LIPASE, BLOOD  CBC  HCG, SERUM, QUALITATIVE    EKG: EKG  Interpretation Date/Time:  Tuesday June 24 2024 09:50:57 EST Ventricular Rate:  106 PR Interval:  136 QRS Duration:  72 QT Interval:  342 QTC Calculation: 455 R Axis:   48  Text Interpretation: Sinus tachycardia Low voltage, precordial leads Confirmed by Simon Rea [phone]) on 06/24/2024 11:33:30 AM  Radiology: [facility] Chest Portable 1 View Result Date: 06/24/2024 CLINICAL DATA:  Shortness of breath, fatigue and nausea. EXAM: PORTABLE CHEST 1 VIEW COMPARISON:  12/02/2022 FINDINGS: The heart size and mediastinal contours are within normal limits. New left lower lung airspace disease consistent with acute pneumonia. No associated pulmonary edema, pleural fluid or pneumothorax.  The visualized skeletal structures are unremarkable. IMPRESSION: New left lower lung airspace disease consistent with acute pneumonia. Electronically Signed   By: Marcey Moan M.D.   On: 06/24/2024 10:28     Procedures   Medications Ordered in the ED  sodium chloride  0.9 % bolus 1,000 mL (0 mLs Intravenous Stopped 06/24/24 1124)  ipratropium-albuterol (DUONEB) 0.5-2.5 (3) MG/3ML nebulizer solution 3 mL (3 mLs Nebulization Given 06/24/24 1025)                                    Medical Decision Making Amount and/or Complexity of Data Reviewed Labs: ordered. Radiology: ordered.  Risk OTC drugs. Prescription drug management.     HPI:     Presents because of cough, present for the past week.  Diarrhea.  Nausea.  Patient states sometimes the cough, and present for the past week.  Nonproductive in nature.  No fever no chills but sometimes feels slightly short of breath with exertion but no pleuritic chest pain or hemoptysis.  Denies any history of DVT or PE.  She states that she is having a nonproductive cough.  Some slight postnasal drip.  Started with some nausea and vomiting yesterday.  Last episode emesis last night.  Last episode of diarrhea last night as well.  No sick contacts that she is aware of.   Denies any abdominal pain.  No melena.  No hematochezia.   Previous medical history reviewed : Patient was last seen in the ED in May 2024.  Palpitations.  Negative workup at that time.  MDM:   Upon exam, patient hemodynamically stable.  Heart rate 1 was in the room was in the mid 90s.  Maps appropriate.  Temperature 99.5.  O2 saturation 98%.  No tachypnea.  When she first checked in, O2 saturation was 89%.  Unclear if this was an error but will continue to monitor on pulse ox here in the ED to make sure there is no evidence hypoxia.  Exam, she does have some minimal expiratory wheezing.  No inspiratory.  Otherwise, lung sounds clear.  No history of COPD or asthma in the past.  Will give 1 round of DuoNeb and reassess.  Will obtain x-ray to make sure there is no evidence of infiltrate.  Obtain COVID RSV and flu CBC, evidence of acute infection with these viral syndromes.  Likely a viral process at this time given patient's constellation of symptoms.  Soft and benign abdomen.  No rebound guarding or tenderness I could appreciate.  No concern for any kind of surgical process at this time.  No occasion for imaging.  Will obtain some screening labs to make sure there is no evidence of dehydration or LFT derangement.  Reevaluation:   Upon reexamination, patient hemodynamically stable.  Remains A&O x 3 with GCS 15.  Lung sounds improved after DuoNeb.  No expiratory wheezing.  O2 saturation remained in the high 90s.  Chest x-ray shows findings concerning for left lower lobe pneumonia.  Given this, started patient on Augmentin and azithromycin.  No other large electrolyte derangements.  No significant leukocytosis.  Very well could just be a viral illness superimposed bacterial pneumonia given the chronicity of symptoms here recently.  Soft and benign abdomen.  No rebound guarding or tenderness.  No concerns any kind of intra-abdominal pathology at this time.  Likely sequela of upper respiratory  infection.  Patient discharged stable addition.   Interventions: 1  L nS , DUONeb   EKG Interpreted by Me: sinus    Cardiac Tele Interpreted by Me: sinus    I have independently interpreted the CXR  Social Determinant of Health: denies smoking    Disposition and Follow Up: pcp       Final diagnoses:  Pneumonia of left lower lobe due to infectious organism  Subacute cough  Wheezing    ED Discharge Orders          Ordered    albuterol (VENTOLIN HFA) 108 (90 Base) MCG/ACT inhaler  Every 6 hours PRN        06/24/24 1249    amoxicillin-clavulanate (AUGMENTIN) 875-125 MG tablet  Every 12 hours        06/24/24 1249    azithromycin (ZITHROMAX) 250 MG tablet  Daily        06/24/24 1249    guaiFENesin (ROBITUSSIN) 100 MG/5ML liquid  Every 4 hours PRN        06/24/24 1249    benzonatate  (TESSALON ) 100 MG capsule  Every 8 hours        06/24/24 1249    ondansetron  (ZOFRAN ) 4 MG tablet  Every 6 hours        06/24/24 1502               Simon Lavonia SAILOR, MD 06/24/24 1503

## 2024-07-08 ENCOUNTER — Emergency Department (HOSPITAL_COMMUNITY)

## 2024-07-08 ENCOUNTER — Emergency Department (HOSPITAL_COMMUNITY)
Admission: EM | Admit: 2024-07-08 | Discharge: 2024-07-08 | Disposition: A | Discharge status: Home / Self Care | Attending: Emergency Medicine | Admitting: Emergency Medicine

## 2024-07-08 ENCOUNTER — Other Ambulatory Visit: Payer: Self-pay

## 2024-07-08 DIAGNOSIS — R0781 Pleurodynia: Secondary | ICD-10-CM | POA: Diagnosis not present

## 2024-07-08 DIAGNOSIS — J189 Pneumonia, unspecified organism: Secondary | ICD-10-CM

## 2024-07-08 DIAGNOSIS — R0789 Other chest pain: Secondary | ICD-10-CM

## 2024-07-08 DIAGNOSIS — J181 Lobar pneumonia, unspecified organism: Secondary | ICD-10-CM | POA: Diagnosis not present

## 2024-07-08 DIAGNOSIS — R059 Cough, unspecified: Secondary | ICD-10-CM | POA: Diagnosis present

## 2024-07-08 MED ORDER — OXYCODONE-ACETAMINOPHEN 5-325 MG PO TABS
1.0000 | ORAL_TABLET | Freq: Once | ORAL | Status: AC
  Administered 2024-07-08: 07:00:00 1 via ORAL
  Filled 2024-07-08: qty 1

## 2024-07-08 MED ORDER — LIDOCAINE 5 % EX PTCH: 1.0000 | MEDICATED_PATCH | CUTANEOUS | 0 refills | Status: AC

## 2024-07-08 MED ORDER — AMOXICILLIN 500 MG PO CAPS: 1000.0000 mg | ORAL_CAPSULE | Freq: Three times a day (TID) | ORAL | 0 refills | Status: AC

## 2024-07-08 MED ORDER — DOXYCYCLINE HYCLATE 100 MG PO CAPS: 100.0000 mg | ORAL_CAPSULE | Freq: Two times a day (BID) | ORAL | 0 refills | Status: AC

## 2024-07-08 MED ORDER — OXYCODONE-ACETAMINOPHEN 5-325 MG PO TABS: 1.0000 | ORAL_TABLET | ORAL | Status: DC | PRN

## 2024-07-08 MED ORDER — BENZONATATE 100 MG PO CAPS: 100.0000 mg | ORAL_CAPSULE | Freq: Three times a day (TID) | ORAL | 0 refills | Status: AC

## 2024-07-08 NOTE — ED Triage Notes (Signed)
 Pt arrives POV with complaints of right sided pain and a cough. Was dx with left sided PNA on 12/2 but states this is worse.

## 2024-07-08 NOTE — ED Provider Notes (Signed)
 [place] EMERGENCY DEPARTMENT AT [facility] Provider Note   CSN: [id number] Arrival date & time: 07/08/24  9348     Patient presents with: No chief complaint on file.   Shelby Nichols is a 33 y.o. female.   Patient presents with recurrent productive cough and right sided rib pain.  Patient denies any history of blood clots, recent surgery, cancer history, leg swelling.  Patient diagnosed with pneumonia 2 weeks ago and finished antibiotics 2 of them.  Patient feels like the pain is worse with coughing.  No persistent fevers.  Tolerating oral liquids.  The history is provided by the patient.       Prior to Admission medications  Medication Sig Start Date End Date Taking? Authorizing Provider  amoxicillin  (AMOXIL ) 500 MG capsule Take 2 capsules (1,000 mg total) by mouth 3 (three) times daily. 07/08/24  Yes Tonia Chew, MD  benzonatate  (TESSALON ) 100 MG capsule Take 1 capsule (100 mg total) by mouth every 8 (eight) hours. 07/08/24  Yes Tonia Chew, MD  doxycycline  (VIBRAMYCIN ) 100 MG capsule Take 1 capsule (100 mg total) by mouth 2 (two) times daily. One po bid x 7 days 07/08/24  Yes Tonia Chew, MD  lidocaine  (LIDODERM ) 5 % Place 1 patch onto the skin daily. Remove & Discard patch within 12 hours or as directed by MD 07/08/24  Yes Tonia Chew, MD  acetaminophen  (TYLENOL ) 325 MG tablet You can buy this over the counter at any drug store.  Take 2 tablets every 4 hours as needed and alternate with ibuprofen  and oxycodone  as needed.DO NOT TAKE MORE THAN 4000 MG OF TYLENOL  PER DAY.  IT CAN HARM YOUR LIVER. Patient not taking: Reported on 03/01/2023 12/04/17   Tonnie George, PA-C  albuterol  (VENTOLIN  HFA) 108 (90 Base) MCG/ACT inhaler Inhale 1-2 puffs into the lungs every 6 (six) hours as needed for wheezing or shortness of breath. 06/24/24   Lemly, Tatum N, MD  amoxicillin -clavulanate (AUGMENTIN ) 875-125 MG tablet Take 1 tablet by mouth every 12 (twelve) hours.  06/24/24   Lemly, Tatum N, MD  clotrimazole  (CLOTRIMAZOLE  ANTI-FUNGAL) 1 % cream Apply 1 Application topically 2 (two) times daily. Apply to affected area in inner thigh 03/01/23   Ndulue, Chiagoziem J, MD  ibuprofen  (ADVIL ,MOTRIN ) 200 MG tablet You can take 2-3 tablets every 6 hours as needed for pain.  You can alternate with plain Tylenol  or oxycodone .  You can buy this over the counter at any drug store. Patient not taking: Reported on 03/01/2023 12/04/17   Tonnie George, PA-C  loperamide  (IMODIUM ) 2 MG capsule Take 1 capsule (2 mg total) by mouth as needed for diarrhea or loose stools. Patient not taking: Reported on 03/01/2023 06/25/22   Kommor, Lum, MD  ondansetron  (ZOFRAN ) 4 MG tablet Take 1 tablet (4 mg total) by mouth every 6 (six) hours. 06/24/24   Simon Lavonia SAILOR, MD  ondansetron  (ZOFRAN -ODT) 4 MG disintegrating tablet Take 1 tablet (4 mg total) by mouth every 8 (eight) hours as needed for nausea or vomiting. Patient not taking: Reported on 03/01/2023 06/25/22   Kommor, Lum, MD  oxyCODONE  (OXY IR/ROXICODONE ) 5 MG immediate release tablet Take 1-2 tablets (5-10 mg total) by mouth every 6 (six) hours as needed for moderate pain, severe pain or breakthrough pain. Patient not taking: Reported on 03/01/2023 12/04/17   Tonnie George, PA-C  Prenat-Fe Poly-Methfol-FA-DHA (VITAFOL  ULTRA) 29-0.6-0.4-200 MG CAPS Take 1 tablet by mouth daily. Patient not taking: Reported on 12/01/2017 03/07/17   Constant, Peggy,  MD    Allergies: Patient has no known allergies.    Review of Systems  Constitutional:  Negative for chills and fever.  HENT:  Positive for congestion.   Eyes:  Negative for visual disturbance.  Respiratory:  Positive for cough. Negative for shortness of breath.   Cardiovascular:  Negative for chest pain.  Gastrointestinal:  Negative for abdominal pain and vomiting.  Genitourinary:  Negative for dysuria and flank pain.  Musculoskeletal:  Negative for back pain, neck pain and neck  stiffness.  Skin:  Negative for rash.  Neurological:  Negative for light-headedness and headaches.    Updated Vital Signs BP 111/74 (BP Location: Right Arm)   Pulse (!) 111   Temp 98.7 F (37.1 C) (Oral)   Resp (!) 22   LMP 06/10/2024   SpO2 98%   Breastfeeding No   Physical Exam  (all labs ordered are listed, but only abnormal results are displayed) Labs Reviewed - No data to display  EKG: None  Radiology: DG Chest 2 View Result Date: 07/08/2024 EXAM: 2 VIEW(S) XRAY OF THE CHEST 07/08/2024 07:23:00 AM COMPARISON: 06/24/2024 CLINICAL HISTORY: chest pain FINDINGS: LUNGS AND PLEURA: Improving patchy left lower lobe airspace disease compared to prior study. No pleural effusion. No pneumothorax. HEART AND MEDIASTINUM: No acute abnormality of the cardiac and mediastinal silhouettes. BONES AND SOFT TISSUES: No acute osseous abnormality. IMPRESSION: 1. Improving patchy left lower lobe airspace disease compared to prior study. Electronically signed by: Evalene Coho MD 07/08/2024 07:26 AM EST RP Workstation: [id number]     Procedures   Medications Ordered in the ED  oxyCODONE -acetaminophen  (PERCOCET/ROXICET) 5-325 MG per tablet 1 tablet (1 tablet Oral Given 07/08/24 0709)                                    Medical Decision Making Amount and/or Complexity of Data Reviewed Radiology: ordered.  Risk Prescription drug management.   Patient with recent pneumonia presents with worsening rib pain and coughing.  Differential includes rib muscle strain, rib fracture, persistent or worsening pneumonia, blood clot.  Patient denies any blood clot risk factors or history.  Patient coughing significant in the room.  Discussed plan for 5 days of additional antibiotics, pain control and if no improvement or worsening we will consider CT scan at that point.  Patient comfortable plan and reasons to return.  Pain meds given in the ED.     Final diagnoses:  Community acquired bilateral  lower lobe pneumonia  Rib pain on right side    ED Discharge Orders          Ordered    amoxicillin  (AMOXIL ) 500 MG capsule  3 times daily        07/08/24 1210    doxycycline  (VIBRAMYCIN ) 100 MG capsule  2 times daily        07/08/24 1210    lidocaine  (LIDODERM ) 5 %  Every 24 hours        07/08/24 1210    benzonatate  (TESSALON ) 100 MG capsule  Every 8 hours        07/08/24 1210               Tonia Chew, MD 07/08/24 1212

## 2024-07-08 NOTE — Discharge Instructions (Addendum)
 Take antibiotics for 5 days.You CANNOT take doxycycline  if you are pregnant or trying to get pregnant so please be aware. Use Tylenol  every 4 hours and ibuprofen  every 6 hours needed for pain.  Use lidocaine  patch as needed for pain. Use tessalon  pearls for cough as needed in addition to lemon and honey. Take antibiotics for 1 week.  If you develop significant shortness of breath or worsening signs or symptoms or no improvement after 1 week please see a clinician for further consideration which may include CT scan or other workup.

## 2024-07-25 ENCOUNTER — Emergency Department (HOSPITAL_COMMUNITY)
Admission: EM | Admit: 2024-07-25 | Discharge: 2024-07-25 | Disposition: A | Discharge status: Home / Self Care | Source: Home / Self Care

## 2024-07-25 ENCOUNTER — Other Ambulatory Visit: Payer: Self-pay

## 2024-07-25 ENCOUNTER — Encounter (HOSPITAL_COMMUNITY): Payer: Self-pay

## 2024-07-25 DIAGNOSIS — L720 Epidermal cyst: Secondary | ICD-10-CM | POA: Insufficient documentation

## 2024-07-25 DIAGNOSIS — M546 Pain in thoracic spine: Secondary | ICD-10-CM | POA: Diagnosis present

## 2024-07-25 DIAGNOSIS — L02212 Cutaneous abscess of back [any part, except buttock]: Secondary | ICD-10-CM | POA: Insufficient documentation

## 2024-07-25 MED ORDER — DOXYCYCLINE HYCLATE 100 MG PO CAPS: 100.0000 mg | ORAL_CAPSULE | Freq: Two times a day (BID) | ORAL | 0 refills | Status: AC

## 2024-07-25 MED ORDER — LIDOCAINE HCL 2 % IJ SOLN
8.5000 mL | INTRAMUSCULAR | Status: DC
  Administered 2024-07-25: 8.5 mL

## 2024-07-25 MED ORDER — LIDOCAINE-EPINEPHRINE (PF) 2 %-1:200000 IJ SOLN
20.0000 mL | Freq: Once | INTRAMUSCULAR | Status: AC
  Administered 2024-07-25: 20 mL
  Filled 2024-07-25: qty 20

## 2024-07-25 NOTE — ED Triage Notes (Signed)
 Patient has an area of redness and tenderness on her back. Denies drainage from the area. Unknown if its related to a an insect bite.

## 2024-07-25 NOTE — Discharge Instructions (Signed)
 As discussed, it is important that you follow-up with primary care and/or a urgent care center to have the packing material removed and the wound reassessed.  Keep the area clean and dry, change out dressing on the back as it becomes soiled and/or wet.  Otherwise continue to take the antibiotics as prescribed, and return to the emergency department if you have further concerns.

## 2024-07-25 NOTE — ED Provider Notes (Signed)
 " [place] [facility] AT [facility] Provider Note   CSN: [id number] Arrival date & time: 07/25/24  0129     Patient presents with: Abscess   Shelby Nichols is a 34 y.o. female who presents with a 2-day history of worsening pain in the back, states that she has a lesion that appeared on the right side of her mid back, has gotten considerably larger and more painful and tender over the last 2 days.  Recently finished a course of amoxicillin  as well as doxycycline  secondary to previous history of pneumonia.  This is resolved however she currently has the previously noted lesion to the back that has been, increasingly painful, cannot tolerate putting any pressure on the area secondary to pain.    Abscess      Prior to Admission medications  Medication Sig Start Date End Date Taking? Authorizing Provider  doxycycline  (VIBRAMYCIN ) 100 MG capsule Take 1 capsule (100 mg total) by mouth 2 (two) times daily. 07/25/24  Yes Myriam Dorn BROCKS, PA  acetaminophen  (TYLENOL ) 325 MG tablet You can buy this over the counter at any drug store.  Take 2 tablets every 4 hours as needed and alternate with ibuprofen  and oxycodone  as needed.DO NOT TAKE MORE THAN 4000 MG OF TYLENOL  PER DAY.  IT CAN HARM YOUR LIVER. Patient not taking: Reported on 03/01/2023 12/04/17   Tonnie George, PA-C  albuterol  (VENTOLIN  HFA) 108 (90 Base) MCG/ACT inhaler Inhale 1-2 puffs into the lungs every 6 (six) hours as needed for wheezing or shortness of breath. 06/24/24   Simon Lavonia SAILOR, MD  amoxicillin  (AMOXIL ) 500 MG capsule Take 2 capsules (1,000 mg total) by mouth 3 (three) times daily. 07/08/24   Zavitz, Joshua, MD  amoxicillin -clavulanate (AUGMENTIN ) 875-125 MG tablet Take 1 tablet by mouth every 12 (twelve) hours. 06/24/24   Simon Lavonia SAILOR, MD  benzonatate  (TESSALON ) 100 MG capsule Take 1 capsule (100 mg total) by mouth every 8 (eight) hours. 07/08/24   Tonia Chew, MD  clotrimazole  (CLOTRIMAZOLE   ANTI-FUNGAL) 1 % cream Apply 1 Application topically 2 (two) times daily. Apply to affected area in inner thigh 03/01/23   Ndulue, Chiagoziem J, MD  doxycycline  (VIBRAMYCIN ) 100 MG capsule Take 1 capsule (100 mg total) by mouth 2 (two) times daily. One po bid x 7 days 07/08/24   Tonia Chew, MD  ibuprofen  (ADVIL ,MOTRIN ) 200 MG tablet You can take 2-3 tablets every 6 hours as needed for pain.  You can alternate with plain Tylenol  or oxycodone .  You can buy this over the counter at any drug store. Patient not taking: Reported on 03/01/2023 12/04/17   Tonnie George, PA-C  lidocaine  (LIDODERM ) 5 % Place 1 patch onto the skin daily. Remove & Discard patch within 12 hours or as directed by MD 07/08/24   Tonia Chew, MD  loperamide  (IMODIUM ) 2 MG capsule Take 1 capsule (2 mg total) by mouth as needed for diarrhea or loose stools. Patient not taking: Reported on 03/01/2023 06/25/22   Kommor, Lum, MD  ondansetron  (ZOFRAN ) 4 MG tablet Take 1 tablet (4 mg total) by mouth every 6 (six) hours. 06/24/24   Simon Lavonia SAILOR, MD  ondansetron  (ZOFRAN -ODT) 4 MG disintegrating tablet Take 1 tablet (4 mg total) by mouth every 8 (eight) hours as needed for nausea or vomiting. Patient not taking: Reported on 03/01/2023 06/25/22   Kommor, Lum, MD  oxyCODONE  (OXY IR/ROXICODONE ) 5 MG immediate release tablet Take 1-2 tablets (5-10 mg total) by mouth every 6 (six)  hours as needed for moderate pain, severe pain or breakthrough pain. Patient not taking: Reported on 03/01/2023 12/04/17   Tonnie George, PA-C  Prenat-Fe Poly-Methfol-FA-DHA (VITAFOL  ULTRA) 29-0.6-0.4-200 MG CAPS Take 1 tablet by mouth daily. Patient not taking: Reported on 12/01/2017 03/07/17   Constant, Peggy, MD    Allergies: Patient has no known allergies.    Review of Systems  Skin:  Positive for wound.  All other systems reviewed and are negative.   Updated Vital Signs BP 100/70 (BP Location: Right Arm)   Pulse 84   Temp 98 F (36.7 C)   Resp 18    Ht 5' (1.524 m)   Wt 108.9 kg   LMP 06/21/2024   SpO2 100%   BMI 46.87 kg/m   Physical Exam Vitals and nursing note reviewed.  Constitutional:      General: She is awake. She is not in acute distress.    Appearance: Normal appearance. She is well-developed and well-groomed.  HENT:     Head: Normocephalic and atraumatic.     Mouth/Throat:     Mouth: Mucous membranes are moist.     Pharynx: Oropharynx is clear.  Eyes:     General: Lids are normal. Vision grossly intact. Gaze aligned appropriately.     Extraocular Movements: Extraocular movements intact.     Conjunctiva/sclera: Conjunctivae normal.     Pupils: Pupils are equal, round, and reactive to light.  Cardiovascular:     Rate and Rhythm: Normal rate and regular rhythm.     Pulses: Normal pulses.     Heart sounds: Normal heart sounds. No murmur heard.    No friction rub. No gallop.  Pulmonary:     Effort: Pulmonary effort is normal.     Breath sounds: Normal breath sounds.  Abdominal:     General: Abdomen is flat. Bowel sounds are normal.     Palpations: Abdomen is soft.  Musculoskeletal:        General: Normal range of motion.     Cervical back: Full passive range of motion without pain, normal range of motion and neck supple.     Right lower leg: No edema.     Left lower leg: No edema.  Skin:    General: Skin is warm and dry.     Capillary Refill: Capillary refill takes less than 2 seconds.     Findings: Abscess present.         Comments: On the right side of the mid back, there is an approximately 2 to 3 cm round nodule that is fluctuant to palpation, incredibly tender.  Overlying skin is erythematous  Neurological:     General: No focal deficit present.     Mental Status: She is alert and oriented to person, place, and time. Mental status is at baseline.  Psychiatric:        Mood and Affect: Mood normal.        Behavior: Behavior is cooperative.     (all labs ordered are listed, but only abnormal results  are displayed) Labs Reviewed - No data to display  EKG: None  Radiology: No results found.   .Incision and Drainage  Date/Time: 07/25/2024 8:09 AM  Performed by: Myriam Dorn BROCKS, PA Authorized by: Myriam Dorn BROCKS, PA   Consent:    Consent obtained:  Verbal   Consent given by:  Patient   Risks, benefits, and alternatives were discussed: yes     Risks discussed:  Bleeding, incomplete drainage and infection   Alternatives  discussed:  No treatment, delayed treatment, alternative treatment, observation and referral Universal protocol:    Procedure explained and questions answered to patient or proxy's satisfaction: yes     Patient identity confirmed:  Verbally with patient, arm band and hospital-assigned identification number Location:    Type:  Abscess   Size:  3 cm   Location:  Trunk   Trunk location:  Back Pre-procedure details:    Skin preparation:  Chlorhexidine  Sedation:    Sedation type:  None Anesthesia:    Anesthesia method:  Local infiltration   Local anesthetic:  8.5 mL lidocaine  2 % Procedure type:    Complexity:  Simple Procedure details:    Ultrasound guidance: no     Needle aspiration: no     Incision types:  Single straight   Incision depth:  Dermal   Wound management:  Probed and deloculated, irrigated with saline and extensive cleaning   Drainage:  Bloody and purulent   Drainage amount:  Moderate   Wound treatment:  Wound left open   Packing materials:  1/4 in iodoform gauze   Amount 1/4 iodoform:  8 in Post-procedure details:    Procedure completion:  Tolerated well, no immediate complications    Medications Ordered in the ED  lidocaine -EPINEPHrine  (XYLOCAINE  W/EPI) 2 %-1:200000 (PF) injection 20 mL (20 mLs Infiltration Given 07/25/24 0751)                                    Medical Decision Making Risk Prescription drug management.   Assessed the wound and noted that this patient had a fluctuant mass on the mid back as noted in the  physical assessment, area is erythematous and tender to palpation.  Differential includes epidermal inclusion cyst as well as subcutaneous abscess.  As noted in the procedure note the area was incised with a single straight incision, probed and deloculated.  Noted purulent material as well as thickened material consistent with an epidermal inclusion cyst.  Along with probing and the loculation a sufficient amount of the cyst wall was removed along with this.  Irrigated with saline to remove the remainder of the material, placed iodoform gauze packing.  Discussed with patient to follow-up with primary care within 2 days to have packing material removed, and wound reassessed.  Will prescribe a course of doxycycline  for wound prophylaxis, otherwise will discharge with outpatient follow-up.     Final diagnoses:  Epidermal inclusion cyst    ED Discharge Orders          Ordered    doxycycline  (VIBRAMYCIN ) 100 MG capsule  2 times daily        07/25/24 0814               Myriam Dorn BROCKS, PA 07/25/24 0815    Neysa Caron PARAS, DO 07/25/24 1727  "

## 2024-07-25 NOTE — ED Notes (Signed)
"  Dressing applied  "
